# Patient Record
Sex: Female | Born: 1995
Health system: Southern US, Community
[De-identification: ages and names within clinical notes are randomized; demographics above are authoritative.]

## PROBLEM LIST (undated history)

## (undated) DIAGNOSIS — F419 Anxiety disorder, unspecified: Secondary | ICD-10-CM

---
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
## 2013-10-31 ENCOUNTER — Emergency Department: Payer: Self-pay | Admitting: Student

## 2013-11-02 ENCOUNTER — Emergency Department: Payer: Self-pay | Admitting: Emergency Medicine

## 2013-11-02 LAB — URINALYSIS, COMPLETE
BACTERIA: NONE SEEN
Bilirubin,UR: NEGATIVE
Blood: NEGATIVE
GLUCOSE, UR: NEGATIVE mg/dL (ref 0–75)
Nitrite: NEGATIVE
PH: 5 (ref 4.5–8.0)
Protein: 30
RBC,UR: 22 /HPF (ref 0–5)
Specific Gravity: 1.029 (ref 1.003–1.030)

## 2013-11-02 LAB — WET PREP, GENITAL

## 2013-11-02 LAB — BETA STREP CULTURE(ARMC)

## 2014-06-28 ENCOUNTER — Emergency Department
Admit: 2014-06-28 | Disposition: A | Discharge status: Home / Self Care | Payer: Self-pay | Admitting: Emergency Medicine

## 2014-07-01 LAB — BETA STREP CULTURE(ARMC)

## 2014-11-06 ENCOUNTER — Emergency Department
Admission: EM | Admit: 2014-11-06 | Discharge: 2014-11-06 | Disposition: A | Discharge status: Home / Self Care | Payer: Self-pay | Attending: Emergency Medicine | Admitting: Emergency Medicine

## 2014-11-06 ENCOUNTER — Encounter: Payer: Self-pay | Admitting: Emergency Medicine

## 2014-11-06 DIAGNOSIS — L0201 Cutaneous abscess of face: Secondary | ICD-10-CM | POA: Insufficient documentation

## 2014-11-06 DIAGNOSIS — Z720 Tobacco use: Secondary | ICD-10-CM | POA: Insufficient documentation

## 2014-11-06 MED ORDER — HYDROCODONE-ACETAMINOPHEN 5-325 MG PO TABS
1.0000 | ORAL_TABLET | ORAL | Status: DC | PRN
Start: 2014-11-06 — End: 2015-04-02

## 2014-11-06 MED ORDER — SULFAMETHOXAZOLE-TRIMETHOPRIM 800-160 MG PO TABS: 1.0000 | ORAL_TABLET | Freq: Two times a day (BID) | ORAL | Status: DC

## 2014-11-06 NOTE — ED Notes (Signed)
Developed swelling to left side of face 2 days ago.Marland Kitchen

## 2014-11-06 NOTE — Discharge Instructions (Signed)
Abscess An abscess (boil or furuncle) is an infected area on or under the skin. This area is filled with yellowish-white fluid (pus) and other material (debris). HOME CARE   Only take medicines as told by your doctor.  If you were given antibiotic medicine, take it as directed. Finish the medicine even if you start to feel better.  If gauze is used, follow your doctor's directions for changing the gauze.  To avoid spreading the infection:  Keep your abscess covered with a bandage.  Wash your hands well.  Do not share personal care items, towels, or whirlpools with others.  Avoid skin contact with others.  Keep your skin and clothes clean around the abscess.  Keep all doctor visits as told. GET HELP RIGHT AWAY IF:   You have more pain, puffiness (swelling), or redness in the wound site.  You have more fluid or blood coming from the wound site.  You have muscle aches, chills, or you feel sick.  You have a fever. MAKE SURE YOU:   Understand these instructions.  Will watch your condition.  Will get help right away if you are not doing well or get worse. Document Released: 08/14/2007 Document Revised: 08/27/2011 Document Reviewed: 05/10/2011 [facility] Patient Information 2015 [place], [place]. This information is not intended to replace advice given to you by your health care provider. Make sure you discuss any questions you have with your health care provider.    MOIST WARM COMPRESSES TO FACE FREQUENTLY SEPTRA DS FOR INFECTION, NORCO FOR PAIN  RETURN TO ER IF ANY SEVERE WORSENING OF YOUR ABSCESS

## 2014-11-06 NOTE — ED Notes (Addendum)
Left sided facial swelling, no dental of note.

## 2014-11-06 NOTE — ED Provider Notes (Signed)
[facility] Emergency Department Provider Note  ____________________________________________  Time seen: Approximately 3:03 PM  I have reviewed the triage vital signs and the nursing notes.   HISTORY  Chief Complaint Facial Swelling   HPI Brittany Braun is a 19 y.o. female is here with complaint of left-sided facial swelling for 2 days. Patient states that she does not have any dental problems that she is aware of. She has not had any fever or chills. No nausea or vomiting. She woke this morning with her face being a little bit more swollen than yesterday. She has not taken any medication thus far for this. She rates her pain a 7 out of 10 at this time.   History reviewed. No pertinent past medical history.  There are no active problems to display for this patient.   History reviewed. No pertinent past surgical history.  Current Outpatient Rx  Name  Route  Sig  Dispense  Refill  . HYDROcodone-acetaminophen (NORCO/VICODIN) 5-325 MG per tablet   Oral   Take 1 tablet by mouth every 4 (four) hours as needed for moderate pain.   20 tablet   0   . sulfamethoxazole-trimethoprim (BACTRIM DS,SEPTRA DS) 800-160 MG per tablet   Oral   Take 1 tablet by mouth 2 (two) times daily.   20 tablet   0     Allergies Review of patient's allergies indicates no known allergies.  No family history on file.  Social History Social History  Substance Use Topics  . Smoking status: Current Every Day Smoker  . Smokeless tobacco: None  . Alcohol Use: No    Review of Systems Constitutional: No fever/chills Eyes: No visual changes. ENT: No sore throat. Cardiovascular: Denies chest pain. Respiratory: Denies shortness of breath. Gastrointestinal:.  No nausea, no vomiting.  Genitourinary: Negative for dysuria. Musculoskeletal: Negative for back pain. Skin: Questionable abscess. Neurological: Negative for headaches, focal weakness or numbness.  10-point ROS  otherwise negative.  ____________________________________________   PHYSICAL EXAM:  VITAL SIGNS: ED Triage Vitals  Enc Vitals Group     BP 11/06/14 1354 113/67 mmHg     Pulse Rate 11/06/14 1354 74     Resp 11/06/14 1354 20     Temp 11/06/14 1354 99.2 F (37.3 C)     Temp Source 11/06/14 1354 Oral     SpO2 11/06/14 1354 99 %     Weight 11/06/14 1354 200 lb (90.719 kg)     Height 11/06/14 1354 5\' 6"  (1.676 m)     Head Cir --      Peak Flow --      Pain Score 11/06/14 1354 7     Pain Loc --      Pain Edu? --      Excl. in GC? --     Constitutional: Alert and oriented. Well appearing and in no acute distress. Eyes: Conjunctivae are normal. PERRL. EOMI. Head: Atraumatic. Nose: No congestion/rhinnorhea. Mouth/Throat: Mucous membranes are moist.  Oropharynx non-erythematous. No dental abscess is seen. Gums is not swollen or tender. Neck: No stridor.   Hematological/Lymphatic/Immunilogical: No cervical adenopathy bilaterally Cardiovascular: Normal rate, regular rhythm. Grossly normal heart sounds.  Good peripheral circulation. Respiratory: Normal respiratory effort.  No retractions. Lungs CTAB. Gastrointestinal: Soft and nontender. No distention. No abdominal bruits. No CVA tenderness. Musculoskeletal: No lower extremity tenderness nor edema.  No joint effusions. Neurologic:  Normal speech and language. No gross focal neurologic deficits are appreciated. No gait instability. Skin:  Skin is warm, dry  and intact. 2 left of her mouth there is a very hard, tender nonfluctuant nodule approximately 2 cm in diameter with mild erythema. Psychiatric: Mood and affect are normal. Speech and behavior are normal.  ____________________________________________   LABS (all labs ordered are listed, but only abnormal results are displayed)  Labs Reviewed - No data to display  PROCEDURES  Procedure(s) performed: None  Critical Care performed:  No  ____________________________________________   INITIAL IMPRESSION / ASSESSMENT AND PLAN / ED COURSE  Pertinent labs & imaging results that were available during my care of the patient were reviewed by me and considered in my medical decision making (see chart for details).  Patient was given a prescription for Norco as needed for pain and Septra DS for infection. She is encouraged to use warm compresses frequently to the area and return to the emergency room in 2 days if not improving, fever, or fluctuant area for [place] to I&D. ____________________________________________   FINAL CLINICAL IMPRESSION(S) / ED DIAGNOSES  Final diagnoses:  Facial abscess      Tommi Rumps, PA-C 11/06/14 1607  Emily Filbert, MD 11/06/14 [phone]

## 2015-04-02 ENCOUNTER — Encounter: Payer: Self-pay | Admitting: Emergency Medicine

## 2015-04-02 ENCOUNTER — Emergency Department
Admission: EM | Admit: 2015-04-02 | Discharge: 2015-04-03 | Disposition: A | Discharge status: Home / Self Care | Payer: Self-pay | Attending: Emergency Medicine | Admitting: Emergency Medicine

## 2015-04-02 DIAGNOSIS — F17210 Nicotine dependence, cigarettes, uncomplicated: Secondary | ICD-10-CM | POA: Insufficient documentation

## 2015-04-02 DIAGNOSIS — Y998 Other external cause status: Secondary | ICD-10-CM | POA: Insufficient documentation

## 2015-04-02 DIAGNOSIS — Z23 Encounter for immunization: Secondary | ICD-10-CM | POA: Insufficient documentation

## 2015-04-02 DIAGNOSIS — Y9289 Other specified places as the place of occurrence of the external cause: Secondary | ICD-10-CM | POA: Insufficient documentation

## 2015-04-02 DIAGNOSIS — S81811A Laceration without foreign body, right lower leg, initial encounter: Secondary | ICD-10-CM

## 2015-04-02 DIAGNOSIS — S71111A Laceration without foreign body, right thigh, initial encounter: Secondary | ICD-10-CM | POA: Insufficient documentation

## 2015-04-02 DIAGNOSIS — Y288XXA Contact with other sharp object, undetermined intent, initial encounter: Secondary | ICD-10-CM | POA: Insufficient documentation

## 2015-04-02 DIAGNOSIS — Y9389 Activity, other specified: Secondary | ICD-10-CM | POA: Insufficient documentation

## 2015-04-02 MED ORDER — NAPROXEN 500 MG PO TABS: 500.0000 mg | ORAL_TABLET | Freq: Two times a day (BID) | ORAL | Status: DC

## 2015-04-02 MED ORDER — HYDROCODONE-ACETAMINOPHEN 5-325 MG PO TABS
2.0000 | ORAL_TABLET | Freq: Once | ORAL | Status: AC
  Administered 2015-04-03: 2 via ORAL

## 2015-04-02 MED ORDER — LIDOCAINE HCL (PF) 1 % IJ SOLN
INTRAMUSCULAR | Status: AC
  Filled 2015-04-02: qty 5

## 2015-04-02 MED ORDER — LIDOCAINE HCL (PF) 1 % IJ SOLN
INTRAMUSCULAR | Status: AC
  Administered 2015-04-02: 5 mL via INTRADERMAL
  Filled 2015-04-02: qty 5

## 2015-04-02 MED ORDER — TETANUS-DIPHTH-ACELL PERTUSSIS 5-2.5-18.5 LF-MCG/0.5 IM SUSP
INTRAMUSCULAR | Status: AC
  Administered 2015-04-02: 0.5 mL via INTRAMUSCULAR
  Filled 2015-04-02: qty 0.5

## 2015-04-02 MED ORDER — TETANUS-DIPHTH-ACELL PERTUSSIS 5-2.5-18.5 LF-MCG/0.5 IM SUSP
0.5000 mL | Freq: Once | INTRAMUSCULAR | Status: AC
  Administered 2015-04-02: 0.5 mL via INTRAMUSCULAR

## 2015-04-02 MED ORDER — LIDOCAINE HCL (PF) 1 % IJ SOLN
5.0000 mL | Freq: Once | INTRAMUSCULAR | Status: AC
  Administered 2015-04-02: 5 mL via INTRADERMAL

## 2015-04-02 MED ORDER — HYDROCODONE-ACETAMINOPHEN 5-325 MG PO TABS: 1.0000 | ORAL_TABLET | ORAL | Status: DC | PRN

## 2015-04-02 NOTE — ED Provider Notes (Signed)
[facility] Emergency Department Provider Note  ____________________________________________  Time seen: Approximately 11:30 PM  I have reviewed the triage vital signs and the nursing notes.   HISTORY  Chief Complaint Extremity Laceration    HPI Brittany Braun is a 20 y.o. female presents for evaluation of laceration to her right upper thigh. Patient states that she was cutting open a box with a box cutter when she sliced her leg. States that this happened approximately 11 hours ago almost 12 hours ago.   History reviewed. No pertinent past medical history.  There are no active problems to display for this patient.   History reviewed. No pertinent past surgical history.  Current Outpatient Rx  Name  Route  Sig  Dispense  Refill  . HYDROcodone-acetaminophen (NORCO) 5-325 MG tablet   Oral   Take 1-2 tablets by mouth every 4 (four) hours as needed for moderate pain.   8 tablet   0   . naproxen (NAPROSYN) 500 MG tablet   Oral   Take 1 tablet (500 mg total) by mouth 2 (two) times daily with a meal.   60 tablet   0     Allergies Review of patient's allergies indicates no known allergies.  History reviewed. No pertinent family history.  Social History Social History  Substance Use Topics  . Smoking status: Current Every Day Smoker -- 3.00 packs/day    Types: Cigarettes  . Smokeless tobacco: None  . Alcohol Use: No    Review of Systems Constitutional: No fever/chills Eyes: No visual changes. ENT: No sore throat. Cardiovascular: Denies chest pain. Respiratory: Denies shortness of breath. Gastrointestinal: No abdominal pain.  No nausea, no vomiting.  No diarrhea.  No constipation. Genitourinary: Negative for dysuria. Musculoskeletal: Negative for back pain. Skin: Laceration right upper leg. Neurological: Negative for headaches, focal weakness or numbness.  10-point ROS otherwise  negative.  ____________________________________________   PHYSICAL EXAM:  VITAL SIGNS: ED Triage Vitals  Enc Vitals Group     BP 04/02/15 2322 122/65 mmHg     Pulse Rate 04/02/15 2322 86     Resp 04/02/15 2322 18     Temp 04/02/15 2322 97.7 F (36.5 C)     Temp Source 04/02/15 2322 Oral     SpO2 04/02/15 2322 100 %     Weight 04/02/15 2322 210 lb (95.255 kg)     Height 04/02/15 2322  (1.676 m)     Head Cir --      Peak Flow --      Pain Score 04/02/15 2323 9     Pain Loc --      Pain Edu? --      Excl. in GC? --     Constitutional: Alert and oriented. Well appearing and in no acute distress. Cardiovascular: Normal rate, regular rhythm. Grossly normal heart sounds.  Good peripheral circulation. Respiratory: Normal respiratory effort.  No retractions. Lungs CTAB. Gastrointestinal: Soft and nontender. No distention. No abdominal bruits. No CVA tenderness. Musculoskeletal: No lower extremity tenderness nor edema.  No joint effusions. Neurologic:  Normal speech and language. No gross focal neurologic deficits are appreciated. No gait instability. Skin:  Skin is warm, dry and an as laceration noted to the right upper thigh. Wound is clean. Psychiatric: Mood and affect are normal. Speech and behavior are normal.  ____________________________________________   LABS (all labs ordered are listed, but only abnormal results are displayed)  Labs Reviewed - No data to display    PROCEDURES  Procedure(s) performed: Yes  LACERATION REPAIR Performed by: Evangeline Dakin Authorized by: Evangeline Dakin Consent: Verbal consent obtained. Risks and benefits: risks, benefits and alternatives were discussed Consent given by: patient Patient identity confirmed: provided demographic data Prepped and Draped in normal sterile fashion Wound explored  Laceration Location: Right upper thigh  Laceration Length: 9 cm  No Foreign Bodies seen or palpated  Anesthesia: local  infiltration  Local anesthetic: lidocaine 1% with out epinephrine  Anesthetic total: 10 ml  Irrigation method: syringe Amount of cleaning: standard  Skin closure: Staples   Number of staples: 8   Technique: Simple interrupted   Patient tolerance: Patient tolerated the procedure well with no immediate complications. Critical Care performed: No  ____________________________________________   INITIAL IMPRESSION / ASSESSMENT AND PLAN / ED COURSE  Pertinent labs & imaging results that were available during my care of the patient were reviewed by me and considered in my medical decision making (see chart for details).  Laceration right upper thigh with box cutter staples placed as noted in the procedure above. Patient return in 1 week for staple removal. Rx given for hydrocodone and Naprosyn 500 mg twice a day. Patient follow-up with PCP as needed. Tetanus given while in the ED. Patient discharged home and voices no other complaints at this time. ____________________________________________   FINAL CLINICAL IMPRESSION(S) / ED DIAGNOSES  Final diagnoses:  Laceration of leg, right, initial encounter     Evangeline Dakin, PA-C 04/03/15 0002  Arnaldo Natal, MD 04/14/15 [phone]

## 2015-04-02 NOTE — ED Notes (Signed)
Pt says she was opening a package and cut her right thigh; this occurred around noon today; clean laceration, bleeding controlled;

## 2015-04-03 MED ORDER — HYDROCODONE-ACETAMINOPHEN 5-325 MG PO TABS
ORAL_TABLET | ORAL | Status: AC
Start: 2015-04-03 — End: 2015-04-03
  Administered 2015-04-03: 2 via ORAL
  Filled 2015-04-03: qty 2

## 2015-04-03 NOTE — Discharge Instructions (Signed)
Laceration Care, Adult °A laceration is a cut that goes through all layers of the skin. The cut also goes into the tissue that is right under the skin. Some cuts heal on their own. Others need to be closed with stitches (sutures), staples, skin adhesive strips, or wound glue. Taking care of your cut lowers your risk of infection and helps your cut to heal better. °HOW TO TAKE CARE OF YOUR CUT °For stitches or staples: °· Keep the wound clean and dry. °· If you were given a bandage (dressing), you should change it at least one time per day or as told by your doctor. You should also change it if it gets wet or dirty. °· Keep the wound completely dry for the first 24 hours or as told by your doctor. After that time, you may take a shower or a bath. However, make sure that the wound is not soaked in water until after the stitches or staples have been removed. °· Clean the wound one time each day or as told by your doctor: °· Wash the wound with soap and water. °· Rinse the wound with water until all of the soap comes off. °· Pat the wound dry with a clean towel. Do not rub the wound. °· After you clean the wound, put a thin layer of antibiotic ointment on it as told by your doctor. This ointment: °· Helps to prevent infection. °· Keeps the bandage from sticking to the wound. °· Have your stitches or staples removed as told by your doctor. °If your doctor used skin adhesive strips:  °· Keep the wound clean and dry. °· If you were given a bandage, you should change it at least one time per day or as told by your doctor. You should also change it if it gets dirty or wet. °· Do not get the skin adhesive strips wet. You can take a shower or a bath, but be careful to keep the wound dry. °· If the wound gets wet, pat it dry with a clean towel. Do not rub the wound. °· Skin adhesive strips fall off on their own. You can trim the strips as the wound heals. Do not remove any strips that are still stuck to the wound. They will  fall off after a while. °If your doctor used wound glue: °· Try to keep your wound dry, but you may briefly wet it in the shower or bath. Do not soak the wound in water, such as by swimming. °· After you take a shower or a bath, gently pat the wound dry with a clean towel. Do not rub the wound. °· Do not do any activities that will make you really sweaty until the skin glue has fallen off on its own. °· Do not apply liquid, cream, or ointment medicine to your wound while the skin glue is still on. °· If you were given a bandage, you should change it at least one time per day or as told by your doctor. You should also change it if it gets dirty or wet. °· If a bandage is placed over the wound, do not let the tape for the bandage touch the skin glue. °· Do not pick at the glue. The skin glue usually stays on for 5-10 days. Then, it falls off of the skin. °General Instructions  °· To help prevent scarring, make sure to cover your wound with sunscreen whenever you are outside after stitches are removed, after adhesive strips are removed,   or when wound glue stays in place and the wound is healed. Make sure to wear a sunscreen of at least 30 SPF. °· Take over-the-counter and prescription medicines only as told by your doctor. °· If you were given antibiotic medicine or ointment, take or apply it as told by your doctor. Do not stop using the antibiotic even if your wound is getting better. °· Do not scratch or pick at the wound. °· Keep all follow-up visits as told by your doctor. This is important. °· Check your wound every day for signs of infection. Watch for: °· Redness, swelling, or pain. °· Fluid, blood, or pus. °· Raise (elevate) the injured area above the level of your heart while you are sitting or lying down, if possible. °GET HELP IF: °· You got a tetanus shot and you have any of these problems at the injection site: °¨ Swelling. °¨ Very bad pain. °¨ Redness. °¨ Bleeding. °· You have a fever. °· A wound that was  closed breaks open. °· You notice a bad smell coming from your wound or your bandage. °· You notice something coming out of the wound, such as wood or glass. °· Medicine does not help your pain. °· You have more redness, swelling, or pain at the site of your wound. °· You have fluid, blood, or pus coming from your wound. °· You notice a change in the color of your skin near your wound. °· You need to change the bandage often because fluid, blood, or pus is coming from the wound. °· You start to have a new rash. °· You start to have numbness around the wound. °GET HELP RIGHT AWAY IF: °· You have very bad swelling around the wound. °· Your pain suddenly gets worse and is very bad. °· You notice painful lumps near the wound or on skin that is anywhere on your body. °· You have a red streak going away from your wound. °· The wound is on your hand or foot and you cannot move a finger or toe like you usually can. °· The wound is on your hand or foot and you notice that your fingers or toes look pale or bluish. °  °This information is not intended to replace advice given to you by your health care provider. Make sure you discuss any questions you have with your health care provider. °  °Document Released: 08/14/2007 Document Revised: 07/12/2014 Document Reviewed: 02/21/2014 °Elsevier Interactive Patient Education ©2016 [employer] ° °Stitches, Staples, or Adhesive Wound Closure °Health care providers use stitches (sutures), staples, and certain glue (skin adhesives) to hold skin together while it heals (wound closure). You may need this treatment after you have surgery or if you cut your skin accidentally. These methods help your skin to heal more quickly and make it less likely that you will have a scar. A wound may take several months to heal completely. °The type of wound you have determines when your wound gets closed. In most cases, the wound is closed as soon as possible (primary skin closure). Sometimes, closure  is delayed so the wound can be cleaned and allowed to heal naturally. This reduces the chance of infection. Delayed closure may be needed if your wound: °· Is caused by a bite. °· Happened more than 6 hours ago. °· Involves loss of skin or the tissues under the skin. °· Has dirt or debris in it that cannot be removed. °· Is infected. °WHAT ARE THE DIFFERENT KINDS OF WOUND   CLOSURES? °There are many options for wound closure. The one that your health care provider uses depends on how deep and how large your wound is. °Adhesive Glue °To use this type of glue to close a wound, your health care provider holds the edges of the wound together and paints the glue on the surface of your skin. You may need more than one layer of glue. Then the wound may be covered with a light bandage (dressing). °This type of skin closure may be used for small wounds that are not deep (superficial). Using glue for wound closure is less painful than other methods. It does not require a medicine that numbs the area (local anesthetic). This method also leaves nothing to be removed. Adhesive glue is often used for children and on facial wounds. °Adhesive glue cannot be used for wounds that are deep, uneven, or bleeding. It is not used inside of a wound.  °Adhesive Strips °These strips are made of sticky (adhesive), porous paper. They are applied across your skin edges like a regular adhesive bandage. You leave them on until they fall off. °Adhesive strips may be used to close very superficial wounds. They may also be used along with sutures to improve the closure of your skin edges.  °Sutures °Sutures are the oldest method of wound closure. Sutures can be made from natural substances, such as silk, or from synthetic materials, such as nylon and steel. They can be made from a material that your body can break down as your wound heals (absorbable), or they can be made from a material that needs to be removed from your skin (nonabsorbable). They  come in many different strengths and sizes. °Your health care provider attaches the sutures to a steel needle on one end. Sutures can be passed through your skin, or through the tissues beneath your skin. Then they are tied and cut. Your skin edges may be closed in one continuous stitch or in separate stitches. °Sutures are strong and can be used for all kinds of wounds. Absorbable sutures may be used to close tissues under the skin. The disadvantage of sutures is that they may cause skin reactions that lead to infection. Nonabsorbable sutures need to be removed. °Staples °When surgical staples are used to close a wound, the edges of your skin on both sides of the wound are brought close together. A staple is placed across the wound, and an instrument secures the edges together. Staples are often used to close surgical cuts (incisions). °Staples are faster to use than sutures, and they cause less skin reaction. Staples need to be removed using a tool that bends the staples away from your skin. °HOW DO I CARE FOR MY WOUND CLOSURE? °· Take medicines only as directed by your health care provider. °· If you were prescribed an antibiotic medicine for your wound, finish it all even if you start to feel better. °· Use ointments or creams only as directed by your health care provider. °· Wash your hands with soap and water before and after touching your wound. °· Do not soak your wound in water. Do not take baths, swim, or use a hot tub until your health care provider approves. °· Ask your health care provider when you can start showering. Cover your wound if directed by your health care provider. °· Do not take out your own sutures or staples. °· Do not pick at your wound. Picking can cause an infection. °· Keep all follow-up visits as directed by   your health care provider. This is important. °HOW LONG WILL I HAVE MY WOUND CLOSURE? °· Leave adhesive glue on your skin until the glue peels away. °· Leave adhesive strips on  your skin until the strips fall off. °· Absorbable sutures will dissolve within several days. °· Nonabsorbable sutures and staples must be removed. The location of the wound will determine how long they stay in. This can range from several days to a couple of weeks. °WHEN SHOULD I SEEK HELP FOR MY WOUND CLOSURE? °Contact your health care provider if: °· You have a fever. °· You have chills. °· You have drainage, redness, swelling, or pain at your wound. °· There is a bad smell coming from your wound. °· The skin edges of your wound start to separate after your sutures have been removed. °· Your wound becomes thick, raised, and darker in color after your sutures come out (scarring). °  °This information is not intended to replace advice given to you by your health care provider. Make sure you discuss any questions you have with your health care provider. °  °Document Released: 11/20/2000 Document Revised: 03/18/2014 Document Reviewed: 08/04/2013 °Elsevier Interactive Patient Education ©2016 [employer] ° °

## 2015-04-10 ENCOUNTER — Emergency Department
Admission: EM | Admit: 2015-04-10 | Discharge: 2015-04-10 | Disposition: A | Discharge status: Home / Self Care | Payer: Self-pay | Attending: Emergency Medicine | Admitting: Emergency Medicine

## 2015-04-10 ENCOUNTER — Encounter: Payer: Self-pay | Admitting: Urgent Care

## 2015-04-10 DIAGNOSIS — Z4802 Encounter for removal of sutures: Secondary | ICD-10-CM | POA: Insufficient documentation

## 2015-04-10 NOTE — ED Provider Notes (Signed)
[facility] Emergency Department Provider Note ____________________________________________  Time seen: 2344  I have reviewed the triage vital signs and the nursing notes.  HISTORY  Chief Complaint  Suture / Staple Removal  HPI  Brittany Braun is a 20 y.o. female presents for staple removal. The wound is well healed without signs of infection. There have been no interim complaints. Staples placed here a week ago after laceration with a box cutter.   Review of Systems  Constitutional: Negative for fever. HEENT:  Normocephalic/atraumatic. Negative for visual/hearing changes, sore throat, or nasal congestion. Cardiovascular: Negative for chest pain. Respiratory: Negative for shortness of breath. Musculoskeletal: Negative for back pain. Skin: right thigh s/p lac repair Neurological: Negative for headaches, focal weakness or numbness. Hematological/Lymphatic:Negative for enlarged lymph nodes  PHYSICAL EXAM:  VITAL SIGNS: ED Triage Vitals  Enc Vitals Group     BP 04/10/15 2258 132/78 mmHg     Pulse Rate 04/10/15 2258 84     Resp 04/10/15 2258 18     Temp 04/10/15 2258 98.4 F (36.9 C)     Temp Source 04/10/15 2258 Oral     SpO2 04/10/15 2258 100 %     Weight --      Height --      Head Cir --      Peak Flow --      Pain Score 04/10/15 2259 0     Pain Loc --      Pain Edu? --      Excl. in GC? --    Constitutional: Alert and oriented. Well appearing and in no distress. Eyes: Conjunctivae are normal. PERRL. Normal extraocular movements. Head: Normocephalic and atraumatic. Neck: Supple. No thyromegaly. Hematological/Lymphatic/Immunological: No cervical lymphadenopathy. Cardiovascular: Normal rate, regular rhythm.  Respiratory: Normal respiratory effort.  Musculoskeletal: Nontender with normal range of motion in all extremities.  Neurologic:  Normal gait without ataxia. Normal speech and language. No gross focal neurologic deficits are  appreciated. Skin:  Skin is warm, dry and intact. No rash noted. Well-healed linear laceration to the upper right thigh with staples in place. No signs of infection.  Psychiatric: Mood and affect are normal. Patient exhibits appropriate insight and judgment. ____________________________________________   SUTURE REMOVAL Performed by: Lissa Hoard  Consent: Verbal consent obtained. Patient identity confirmed: provided demographic data  Location details: right thigh  Wound Appearance: clean  Sutures/Staples Removed: 9 staples  Facility: sutures placed in this facility   Patient tolerance: Patient tolerated the procedure well with no immediate complications.  INITIAL IMPRESSION / ASSESSMENT AND PLAN / ED COURSE  The staples are removed. Wound care and activity instructions given. Return prn.  FINAL CLINICAL IMPRESSION(S) / ED DIAGNOSES  Final diagnoses:  Encounter for staple removal    Lissa Hoard, PA-C 04/11/15 0034  Sharyn Creamer, MD 04/11/15 2047

## 2015-04-10 NOTE — Discharge Instructions (Signed)
Suture Removal, Care After Refer to this sheet in the next few weeks. These instructions provide you with information on caring for yourself after your procedure. Your health care provider may also give you more specific instructions. Your treatment has been planned according to current medical practices, but problems sometimes occur. Call your health care provider if you have any problems or questions after your procedure. WHAT TO EXPECT AFTER THE PROCEDURE After your stitches (sutures) are removed, it is typical to have the following:  Some discomfort and swelling in the wound area.  Slight redness in the area. HOME CARE INSTRUCTIONS   If you have skin adhesive strips over the wound area, do not take the strips off. They will fall off on their own in a few days. If the strips remain in place after 14 days, you may remove them.  Change any bandages (dressings) at least once a day or as directed by your health care provider. If the bandage sticks, soak it off with warm, soapy water.  Apply cream or ointment only as directed by your health care provider. If using cream or ointment, wash the area with soap and water 2 times a day to remove all the cream or ointment. Rinse off the soap and pat the area dry with a clean towel.  Keep the wound area dry and clean. If the bandage becomes wet or dirty, or if it develops a bad smell, change it as soon as possible.  Continue to protect the wound from injury.  Use sunscreen when out in the sun. New scars become sunburned easily. SEEK MEDICAL CARE IF:  You have increasing redness, swelling, or pain in the wound.  You see pus coming from the wound.  You have a fever.  You notice a bad smell coming from the wound or dressing.  Your wound breaks open (edges not staying together).   This information is not intended to replace advice given to you by your health care provider. Make sure you discuss any questions you have with your health care  provider.   Document Released: 11/20/2000 Document Revised: 12/16/2012 Document Reviewed: 10/07/2012 Elsevier Interactive Patient Education [employer]  Keep the wound clean, dry, and covered.

## 2015-04-10 NOTE — ED Notes (Signed)
Patient presents for staple removal from RIGHT leg; has 9 in place per her report. Denies s/s of infection; no fever.

## 2015-04-10 NOTE — ED Notes (Signed)

## 2015-07-07 ENCOUNTER — Emergency Department
Admission: EM | Admit: 2015-07-07 | Discharge: 2015-07-07 | Disposition: A | Discharge status: Home / Self Care | Payer: Self-pay | Attending: Emergency Medicine | Admitting: Emergency Medicine

## 2015-07-07 DIAGNOSIS — R197 Diarrhea, unspecified: Secondary | ICD-10-CM | POA: Insufficient documentation

## 2015-07-07 DIAGNOSIS — F17210 Nicotine dependence, cigarettes, uncomplicated: Secondary | ICD-10-CM | POA: Insufficient documentation

## 2015-07-07 DIAGNOSIS — Z5321 Procedure and treatment not carried out due to patient leaving prior to being seen by health care provider: Secondary | ICD-10-CM | POA: Insufficient documentation

## 2015-07-07 LAB — URINALYSIS COMPLETE WITH MICROSCOPIC (ARMC ONLY)
BILIRUBIN URINE: NEGATIVE
Bacteria, UA: NONE SEEN
GLUCOSE, UA: NEGATIVE mg/dL
Leukocytes, UA: NEGATIVE
Nitrite: NEGATIVE
Protein, ur: NEGATIVE mg/dL
SPECIFIC GRAVITY, URINE: 1.024 (ref 1.005–1.030)
pH: 6 (ref 5.0–8.0)

## 2015-07-07 LAB — COMPREHENSIVE METABOLIC PANEL
ALBUMIN: 4.6 g/dL (ref 3.5–5.0)
ALT: 10 U/L — AB (ref 14–54)
AST: 16 U/L (ref 15–41)
Alkaline Phosphatase: 58 U/L (ref 38–126)
Anion gap: 8 (ref 5–15)
BUN: 13 mg/dL (ref 6–20)
CHLORIDE: 107 mmol/L (ref 101–111)
CO2: 24 mmol/L (ref 22–32)
CREATININE: 0.75 mg/dL (ref 0.44–1.00)
Calcium: 9.2 mg/dL (ref 8.9–10.3)
GFR calc Af Amer: >60 mL/min (ref >60)
GFR calc non Af Amer: >60 mL/min (ref >60)
Glucose, Bld: 89 mg/dL (ref 65–99)
POTASSIUM: 3.7 mmol/L (ref 3.5–5.1)
SODIUM: 139 mmol/L (ref 135–145)
Total Bilirubin: 0.3 mg/dL (ref 0.3–1.2)
Total Protein: 8 g/dL (ref 6.5–8.1)

## 2015-07-07 LAB — CBC
HEMATOCRIT: 35.1 % (ref 35.0–47.0)
Hemoglobin: 11.7 g/dL — ABNORMAL LOW (ref 12.0–16.0)
MCH: 27.8 pg (ref 26.0–34.0)
MCHC: 33.2 g/dL (ref 32.0–36.0)
MCV: 83.6 fL (ref 80.0–100.0)
PLATELETS: 236 10*3/uL (ref 150–440)
RBC: 4.2 MIL/uL (ref 3.80–5.20)
RDW: 15.4 % — AB (ref 11.5–14.5)
WBC: 6.4 10*3/uL (ref 3.6–11.0)

## 2015-07-07 LAB — LIPASE, BLOOD: LIPASE: 12 U/L (ref 11–51)

## 2015-07-07 MED ORDER — ONDANSETRON 4 MG PO TBDP
4.0000 mg | ORAL_TABLET | Freq: Once | ORAL | Status: AC | PRN
  Administered 2015-07-07: 4 mg via ORAL

## 2015-07-07 MED ORDER — ONDANSETRON 4 MG PO TBDP
ORAL_TABLET | ORAL | Status: AC
  Filled 2015-07-07: qty 1

## 2015-07-07 NOTE — ED Notes (Signed)
No answer when called for treatment room.  ?

## 2015-07-07 NOTE — ED Notes (Signed)
States she developed some n/v this am  Last time vomited was this am  Also having some abd cramping

## 2015-07-07 NOTE — ED Notes (Signed)
PT arrives to ER via POV from home, ambulatory to triage. Pt c/o nausea, and diarrhea all day. Pt vomit X 2 this AM but none sense. Denies abdominal pain. Pt alert and oriented X4, active, cooperative, pt in NAD. RR even and unlabored, color WNL.

## 2015-10-01 ENCOUNTER — Encounter: Payer: Self-pay | Admitting: Emergency Medicine

## 2015-10-01 ENCOUNTER — Emergency Department
Admission: EM | Admit: 2015-10-01 | Discharge: 2015-10-01 | Disposition: A | Discharge status: Home / Self Care | Payer: Self-pay | Attending: Emergency Medicine | Admitting: Emergency Medicine

## 2015-10-01 DIAGNOSIS — J029 Acute pharyngitis, unspecified: Secondary | ICD-10-CM | POA: Insufficient documentation

## 2015-10-01 DIAGNOSIS — F17210 Nicotine dependence, cigarettes, uncomplicated: Secondary | ICD-10-CM | POA: Insufficient documentation

## 2015-10-01 LAB — POCT RAPID STREP A: Streptococcus, Group A Screen (Direct): NEGATIVE

## 2015-10-01 MED ORDER — IBUPROFEN 800 MG PO TABS: 800.0000 mg | ORAL_TABLET | Freq: Three times a day (TID) | ORAL | 0 refills | Status: DC | PRN

## 2015-10-01 NOTE — ED Triage Notes (Signed)
Pt reports sore throat and right ear pain that started 3 days ago. Pain with swallowing fever 100.1 triage

## 2015-10-01 NOTE — ED Provider Notes (Signed)
[facility] Emergency Department Provider Note    ____________________________________________   I have reviewed the triage vital signs and the nursing notes.   HISTORY  Chief Complaint Sore Throat   History limited by: Not Limited   HPI Brittany Braun is a 20 y.o. female who presents to the emergency department today because of concerns for sore throat. Has been going on for the past 2-3 days. The pain is worse in the morning. It makes it difficult for her to swallow. The patient states that she also feels like the discomfort is going into her ears. This has happened twice in the past. She does not have a primary care doctor.    History reviewed. No pertinent past medical history.  There are no active problems to display for this patient.   History reviewed. No pertinent surgical history.  Current Outpatient Rx  . Order #: [id number] Class: Print  . Order #: [id number] Class: Print    Allergies Review of patient's allergies indicates no known allergies.  No family history on file.  Social History Social History  Substance Use Topics  . Smoking status: Current Every Day Smoker    Packs/day: 3.00    Types: Cigarettes  . Smokeless tobacco: Never Used  . Alcohol use Yes    Review of Systems  Constitutional: Positive for low grade fever. Cardiovascular: Negative for chest pain. Respiratory: Negative for shortness of breath. Gastrointestinal: Negative for abdominal pain, vomiting and diarrhea. Neurological: Negative for headaches, focal weakness or numbness.  10-point ROS otherwise negative.  ____________________________________________   PHYSICAL EXAM:  VITAL SIGNS: ED Triage Vitals  Enc Vitals Group     BP 10/01/15 1807 122/79     Pulse Rate 10/01/15 1807 (!) 117     Resp 10/01/15 1807 20     Temp 10/01/15 1807 100.1 F (37.8 C)     Temp Source 10/01/15 1807 Oral     SpO2 10/01/15 1807 98 %     Weight 10/01/15 1807 200 lb  (90.7 kg)     Height 10/01/15 1807  (1.676 m)     Head Circumference --      Peak Flow --      Pain Score 10/01/15 1808 10   Constitutional: Alert and oriented. Well appearing and in no distress. Eyes: Conjunctivae are normal. PERRL. Normal extraocular movements. ENT   Head: Normocephalic and atraumatic.   Nose: No congestion/rhinnorhea.   Mouth/Throat: Mucous membranes are moist. Bilateral tonsil swelling, some exudates. No uvula deviation.   Neck: No stridor. Hematological/Lymphatic/Immunilogical: No cervical lymphadenopathy. Cardiovascular: Normal rate, regular rhythm.  No murmurs, rubs, or gallops. Respiratory: Normal respiratory effort without tachypnea nor retractions. Breath sounds are clear and equal bilaterally. No wheezes/rales/rhonchi. Gastrointestinal: Soft and nontender. No distention.  Genitourinary: Deferred Musculoskeletal: Normal range of motion in all extremities. No joint effusions.   Neurologic:  Normal speech and language. No gross focal neurologic deficits are appreciated.  Skin:  Skin is warm, dry and intact. No rash noted. Psychiatric: Mood and affect are normal. Speech and behavior are normal. Patient exhibits appropriate insight and judgment.  ____________________________________________    LABS (pertinent positives/negatives)  Labs Reviewed  POCT RAPID STREP A     ____________________________________________   EKG  None  ____________________________________________    RADIOLOGY  None  ____________________________________________   PROCEDURES  Procedures  ____________________________________________   INITIAL IMPRESSION / ASSESSMENT AND PLAN / ED COURSE  Pertinent labs & imaging results that were available during my care of the patient  were reviewed by me and considered in my medical decision making (see chart for details).  Patient presents with concern for sore throat. Will check rapid strep.   Clinical  Course   Rapid strep negative. Think likely viral pharyngitis. Will give NSAID prescription. ____________________________________________   FINAL CLINICAL IMPRESSION(S) / ED DIAGNOSES  Final diagnoses:  Pharyngitis     Note: This dictation was prepared with Dragon dictation. Any transcriptional errors that result from this process are unintentional    Phineas Semen, MD 10/01/15 2036

## 2015-10-01 NOTE — Discharge Instructions (Signed)
Please seek medical attention for any high fevers, chest pain, shortness of breath, change in behavior, persistent vomiting, bloody stool or any other new or concerning symptoms.  

## 2015-10-03 ENCOUNTER — Emergency Department
Admission: EM | Admit: 2015-10-03 | Discharge: 2015-10-03 | Disposition: A | Discharge status: Home / Self Care | Payer: Self-pay | Attending: Emergency Medicine | Admitting: Emergency Medicine

## 2015-10-03 DIAGNOSIS — J36 Peritonsillar abscess: Secondary | ICD-10-CM | POA: Insufficient documentation

## 2015-10-03 DIAGNOSIS — F17210 Nicotine dependence, cigarettes, uncomplicated: Secondary | ICD-10-CM | POA: Insufficient documentation

## 2015-10-03 LAB — COMPREHENSIVE METABOLIC PANEL
ALK PHOS: 64 U/L (ref 38–126)
ALT: 23 U/L (ref 14–54)
AST: 23 U/L (ref 15–41)
Albumin: 3.9 g/dL (ref 3.5–5.0)
Anion gap: 8 (ref 5–15)
BILIRUBIN TOTAL: 0.7 mg/dL (ref 0.3–1.2)
BUN: 7 mg/dL (ref 6–20)
CALCIUM: 8.8 mg/dL — AB (ref 8.9–10.3)
CHLORIDE: 104 mmol/L (ref 101–111)
CO2: 24 mmol/L (ref 22–32)
CREATININE: 0.67 mg/dL (ref 0.44–1.00)
Glucose, Bld: 81 mg/dL (ref 65–99)
Potassium: 3.7 mmol/L (ref 3.5–5.1)
Sodium: 136 mmol/L (ref 135–145)
Total Protein: 7.7 g/dL (ref 6.5–8.1)

## 2015-10-03 LAB — CBC WITH DIFFERENTIAL/PLATELET
BASOS ABS: 0.1 10*3/uL (ref 0–0.1)
Basophils Relative: 1 %
EOS PCT: 0 %
Eosinophils Absolute: 0 10*3/uL (ref 0–0.7)
HEMATOCRIT: 32.3 % — AB (ref 35.0–47.0)
HEMOGLOBIN: 11.1 g/dL — AB (ref 12.0–16.0)
LYMPHS ABS: 1.4 10*3/uL (ref 1.0–3.6)
LYMPHS PCT: 16 %
MCH: 28.5 pg (ref 26.0–34.0)
MCHC: 34.5 g/dL (ref 32.0–36.0)
MCV: 82.5 fL (ref 80.0–100.0)
Monocytes Absolute: 0.9 10*3/uL (ref 0.2–0.9)
Monocytes Relative: 10 %
NEUTROS ABS: 6.5 10*3/uL (ref 1.4–6.5)
Neutrophils Relative %: 73 %
PLATELETS: 240 10*3/uL (ref 150–440)
RBC: 3.91 MIL/uL (ref 3.80–5.20)
RDW: 14.3 % (ref 11.5–14.5)
WBC: 8.8 10*3/uL (ref 3.6–11.0)

## 2015-10-03 LAB — MONONUCLEOSIS SCREEN: MONO SCREEN: NEGATIVE

## 2015-10-03 LAB — POCT PREGNANCY, URINE: PREG TEST UR: NEGATIVE

## 2015-10-03 MED ORDER — AMOXICILLIN-POT CLAVULANATE 875-125 MG PO TABS: 1.0000 | ORAL_TABLET | Freq: Two times a day (BID) | ORAL | 0 refills | Status: AC

## 2015-10-03 MED ORDER — HYDROCODONE-ACETAMINOPHEN 5-325 MG PO TABS: 1.0000 | ORAL_TABLET | ORAL | 0 refills | Status: DC | PRN

## 2015-10-03 MED ORDER — SODIUM CHLORIDE 0.9 % IV SOLN
3.0000 g | Freq: Once | INTRAVENOUS | Status: AC
  Administered 2015-10-03: 3 g via INTRAVENOUS
  Filled 2015-10-03: qty 3

## 2015-10-03 MED ORDER — DEXAMETHASONE SODIUM PHOSPHATE 10 MG/ML IJ SOLN
10.0000 mg | Freq: Once | INTRAMUSCULAR | Status: AC
  Administered 2015-10-03: 10 mg via INTRAVENOUS
  Filled 2015-10-03: qty 1

## 2015-10-03 NOTE — ED Triage Notes (Signed)
Pt states she was seen here 2 days with sore throat and sinus pressure pain, states her sx are not getting any better and has been taking IBU without any relief.Marland Kitchen

## 2015-10-03 NOTE — Discharge Instructions (Signed)
Continue taking antibiotics until completely finished. Norco as needed for pain. Take Augmentin 875 twice a day for 10 days. Follow-up with Dr. Andee Poles if any continued problems. You will need to call and make an appointment in his office.

## 2015-10-03 NOTE — ED Provider Notes (Signed)
[facility] Emergency Department Provider Note  ____________________________________________  Time seen: Approximately 1:52 PM  I have reviewed the triage vital signs and the nursing notes.   HISTORY  Chief Complaint Sore Throat and Nasal Congestion    HPI Brittany Braun is a 20 y.o. female is here with complaint of sore throat. Patient was seen in the emergency room approximately 2 days ago at which time it was thought that her sore throat was file. Patient states that she is continued to have throat pain and a low-grade fever. Patient states that she took ibuprofen this morning and approximately 4 hours prior to arrival in the emergency room. Patient continues to eat and drink and denies any difficulty swallowing her saliva.Currently she rates her pain as a 10 over 10. She denies any nausea, vomiting. She states nothing is helping with her throat pain and that eating sometimes makes her pain worse.   History reviewed. No pertinent past medical history.  There are no active problems to display for this patient.   History reviewed. No pertinent surgical history.  Current Outpatient Rx  . Order #: [id number] Class: Print  . Order #: [id number] Class: Print    Allergies Review of patient's allergies indicates no known allergies.  No family history on file.  Social History Social History  Substance Use Topics  . Smoking status: Current Every Day Smoker    Packs/day: 3.00    Types: Cigarettes  . Smokeless tobacco: Never Used  . Alcohol use Yes    Review of Systems Constitutional: No fever/chills NWG:NFAOZHYQ sore throat. Cardiovascular: Denies chest pain. Respiratory: Denies shortness of breath. Gastrointestinal: No abdominal pain.  No nausea, no vomiting.   Musculoskeletal: Negative for back pain. Skin: Negative for rash. Neurological: Negative for headaches, focal weakness or numbness.  10-point ROS otherwise  negative.  ____________________________________________   PHYSICAL EXAM:  VITAL SIGNS: ED Triage Vitals  Enc Vitals Group     BP 10/03/15 1342 122/71     Pulse Rate 10/03/15 1342 81     Resp 10/03/15 1342 16     Temp 10/03/15 1342 98.7 F (37.1 C)     Temp Source 10/03/15 1342 Oral     SpO2 10/03/15 1342 99 %     Weight 10/03/15 1342 200 lb (90.7 kg)     Height 10/03/15 1342  (1.676 m)     Head Circumference --      Peak Flow --      Pain Score 10/03/15 1341 10     Pain Loc --      Pain Edu? --      Excl. in GC? --     Constitutional: Alert and oriented. Well appearing and in no acute distress. Eyes: Conjunctivae are normal. PERRL. EOMI. Head: Atraumatic. Nose: No congestion/rhinnorhea. Mouth/Throat: Mucous membranes are moist.  Oropharynx Slight erythema is present with some posterior drainage. Bilateral tonsils are slightly enlarged with the right being larger than the left and more erythematous. No exudate was seen. There is no shifting of the uvula. Patient is swallowing saliva and drink fluids without any difficulty. Neck: No stridor.   Hematological/Lymphatic/Immunilogical: Minimal bilateral cervical lymphadenopathy. Moderate tenderness. Cardiovascular: Normal rate, regular rhythm. Grossly normal heart sounds.  Good peripheral circulation. Respiratory: Normal respiratory effort.  No retractions. Lungs CTAB. Musculoskeletal: Moves upper and lower extremities without any difficulty and normal gait was noted. Neurologic: Speech is muffled. No gross focal neurologic deficits are appreciated. No gait instability. Skin:  Skin is warm,  dry and intact. No rash noted. Psychiatric: Mood and affect are normal. Speech and behavior are normal.  ____________________________________________   LABS (all labs ordered are listed, but only abnormal results are displayed)  Labs Reviewed  CBC WITH DIFFERENTIAL/PLATELET - Abnormal; Notable for the following:       Result Value    Hemoglobin 11.1 (*)    HCT 32.3 (*)    All other components within normal limits  COMPREHENSIVE METABOLIC PANEL - Abnormal; Notable for the following:    Calcium 8.8 (*)    All other components within normal limits  MONONUCLEOSIS SCREEN  POC URINE PREG, ED  POCT PREGNANCY, URINE    PROCEDURES  Procedure(s) performed: None  Procedures  Critical Care performed: No  ____________________________________________   INITIAL IMPRESSION / ASSESSMENT AND PLAN / ED COURSE  Pertinent labs & imaging results that were available during my care of the patient were reviewed by me and considered in my medical decision making (see chart for details).    Clinical Course  Patient was given Decadron 10 mg IV while in the emergency room along with 3 g of Unasyn IV. Patient was discharged on Augmentin 875 twice a day for 10 days along with Norco as needed for pain. Patient is to follow-up with Dr.Vaught at [facility] if any continued problems with her throat. Patient was drinking fluids while in the emergency room and had no difficulty swallowing.   ____________________________________________   FINAL CLINICAL IMPRESSION(S) / ED DIAGNOSES  Final diagnoses:  Peritonsillar cellulitis      NEW MEDICATIONS STARTED DURING THIS VISIT:  New Prescriptions   AMOXICILLIN-CLAVULANATE (AUGMENTIN) 875-125 MG TABLET    Take 1 tablet by mouth 2 (two) times daily.   HYDROCODONE-ACETAMINOPHEN (NORCO/VICODIN) 5-325 MG TABLET    Take 1 tablet by mouth every 4 (four) hours as needed for moderate pain.     Note:  This document was prepared using Dragon voice recognition software and may include unintentional dictation errors.    Tommi Rumps, PA-C 10/03/15 1647    Nita Sickle, MD 10/03/15 2213

## 2016-02-12 ENCOUNTER — Encounter: Payer: Self-pay | Admitting: Medical Oncology

## 2016-02-12 ENCOUNTER — Emergency Department
Admission: EM | Admit: 2016-02-12 | Discharge: 2016-02-12 | Disposition: A | Discharge status: Home / Self Care | Payer: Self-pay | Attending: Emergency Medicine | Admitting: Emergency Medicine

## 2016-02-12 DIAGNOSIS — F17210 Nicotine dependence, cigarettes, uncomplicated: Secondary | ICD-10-CM | POA: Insufficient documentation

## 2016-02-12 DIAGNOSIS — Z79899 Other long term (current) drug therapy: Secondary | ICD-10-CM | POA: Insufficient documentation

## 2016-02-12 DIAGNOSIS — J029 Acute pharyngitis, unspecified: Secondary | ICD-10-CM | POA: Insufficient documentation

## 2016-02-12 LAB — POCT RAPID STREP A: STREPTOCOCCUS, GROUP A SCREEN (DIRECT): NEGATIVE

## 2016-02-12 MED ORDER — FEXOFENADINE-PSEUDOEPHED ER 60-120 MG PO TB12: 1.0000 | ORAL_TABLET | Freq: Two times a day (BID) | ORAL | 0 refills | Status: DC

## 2016-02-12 MED ORDER — MAGIC MOUTHWASH W/LIDOCAINE: 5.0000 mL | Freq: Four times a day (QID) | ORAL | 0 refills | Status: DC

## 2016-02-12 NOTE — ED Notes (Signed)
See triage note  States she woke up with sore throat  Unsure of fever  Throat red and swollen

## 2016-02-12 NOTE — ED Triage Notes (Signed)
Pt reports she woke up this am with sore throat.

## 2016-02-12 NOTE — ED Provider Notes (Signed)
[facility] Emergency Department Provider Note   ____________________________________________   First MD Initiated Contact with Patient 02/12/16 [phone]     (approximate)  I have reviewed the triage vital signs and the nursing notes.   HISTORY  Chief Complaint Sore Throat    HPI Brittany Braun is a 20 y.o. female awakened this morning with sore throat. Patient denies any fever associated this complaint. Patient also complaining of bilateral ear pressure. Patient denies any fever or chills associated this complaint. Patient denies nausea vomiting or diarrhea. No palliative measures for this complaint. Patient rates the pain as a 9/10. Patient described a pain as "achy".  History reviewed. No pertinent past medical history.  There are no active problems to display for this patient.   No past surgical history on file.  Prior to Admission medications   Medication Sig Start Date End Date Taking? Authorizing Provider  fexofenadine-pseudoephedrine (ALLEGRA-D) 60-120 MG 12 hr tablet Take 1 tablet by mouth 2 (two) times daily. 02/12/16   Joni Reiningonald K Zacari Stiff, PA-C  HYDROcodone-acetaminophen (NORCO/VICODIN) 5-325 MG tablet Take 1 tablet by mouth every 4 (four) hours as needed for moderate pain. 10/03/15   Tommi Rumpshonda L Summers, PA-C  magic mouthwash w/lidocaine SOLN Take 5 mLs by mouth 4 (four) times daily. 02/12/16   Joni Reiningonald K Arleta Ostrum, PA-C    Allergies Patient has no known allergies.  No family history on file.  Social History Social History  Substance Use Topics  . Smoking status: Current Every Day Smoker    Packs/day: 3.00    Types: Cigarettes  . Smokeless tobacco: Never Used  . Alcohol use Yes    Review of Systems Constitutional: No fever/chills Eyes: No visual changes. ENT: Sore throat and ear pressure.  Cardiovascular: Denies chest pain. Respiratory: Denies shortness of breath. Gastrointestinal: No abdominal pain.  No nausea, no vomiting.  No diarrhea.  No  constipation. Genitourinary: Negative for dysuria. Musculoskeletal: Negative for back pain. Skin: Negative for rash. Neurological: Negative for headaches, focal weakness or numbness.    ____________________________________________   PHYSICAL EXAM:  VITAL SIGNS: ED Triage Vitals  Enc Vitals Group     BP 02/12/16 0901 120/70     Pulse Rate 02/12/16 0901 91     Resp 02/12/16 0901 16     Temp 02/12/16 0901 98.2 F (36.8 C)     Temp Source 02/12/16 0901 Oral     SpO2 02/12/16 0901 97 %     Weight 02/12/16 0855 222 lb (100.7 kg)     Height 02/12/16 0855 5\' 6"  (1.676 m)     Head Circumference --      Peak Flow --      Pain Score 02/12/16 0855 9     Pain Loc --      Pain Edu? --      Excl. in GC? --     Constitutional: Alert and oriented. Well appearing and in no acute distress. Eyes: Conjunctivae are normal. PERRL. EOMI. Head: Atraumatic. Nose: No congestion/rhinnorhea. Mouth/Throat: Mucous membranes are moist.  Oropharynx non-erythematous. Neck: No stridor.  No cervical spine tenderness to palpation. Hematological/Lymphatic/Immunilogical: No cervical lymphadenopathy. Cardiovascular: Normal rate, regular rhythm. Grossly normal heart sounds.  Good peripheral circulation. Respiratory: Normal respiratory effort.  No retractions. Lungs CTAB. Gastrointestinal: Soft and nontender. No distention. No abdominal bruits. No CVA tenderness. Musculoskeletal: No lower extremity tenderness nor edema.  No joint effusions. Neurologic:  Normal speech and language. No gross focal neurologic deficits are appreciated. No gait instability. Skin:  Skin is warm, dry and intact. No rash noted. Psychiatric: Mood and affect are normal. Speech and behavior are normal.  ____________________________________________   LABS (all labs ordered are listed, but only abnormal results are displayed)  Labs Reviewed  POCT RAPID STREP A    ____________________________________________  EKG   ____________________________________________  RADIOLOGY   ____________________________________________   PROCEDURES  Procedure(s) performed: None  Procedures  Critical Care performed: No  ____________________________________________   INITIAL IMPRESSION / ASSESSMENT AND PLAN / ED COURSE  Pertinent labs & imaging results that were available during my care of the patient were reviewed by me and considered in my medical decision making (see chart for details).  Viral pharyngitis. Discussed negative rapid strep test patient advised culture is pending. Patient given a prescription for Magic mouthwash and fexofenadine. Patient advised to [facility] if condition persists.  Clinical Course      ____________________________________________   FINAL CLINICAL IMPRESSION(S) / ED DIAGNOSES  Final diagnoses:  Viral pharyngitis      NEW MEDICATIONS STARTED DURING THIS VISIT:  New Prescriptions   FEXOFENADINE-PSEUDOEPHEDRINE (ALLEGRA-D) 60-120 MG 12 HR TABLET    Take 1 tablet by mouth 2 (two) times daily.   MAGIC MOUTHWASH W/LIDOCAINE SOLN    Take 5 mLs by mouth 4 (four) times daily.     Note:  This document was prepared using Dragon voice recognition software and may include unintentional dictation errors.    Joni Reiningonald K Blessed Cotham, PA-C 02/12/16 [id number]    Rockne MenghiniAnne-Caroline Norman, MD 02/12/16 [phone]

## 2016-03-20 ENCOUNTER — Encounter: Payer: Self-pay | Admitting: *Deleted

## 2016-03-20 ENCOUNTER — Emergency Department
Admission: EM | Admit: 2016-03-20 | Discharge: 2016-03-20 | Disposition: A | Discharge status: Home / Self Care | Payer: Self-pay | Attending: Emergency Medicine | Admitting: Emergency Medicine

## 2016-03-20 DIAGNOSIS — F17210 Nicotine dependence, cigarettes, uncomplicated: Secondary | ICD-10-CM | POA: Insufficient documentation

## 2016-03-20 DIAGNOSIS — J0100 Acute maxillary sinusitis, unspecified: Secondary | ICD-10-CM

## 2016-03-20 MED ORDER — IBUPROFEN 600 MG PO TABS: 600.0000 mg | ORAL_TABLET | Freq: Three times a day (TID) | ORAL | 0 refills | Status: DC | PRN

## 2016-03-20 MED ORDER — HYDROCOD POLST-CPM POLST ER 10-8 MG/5ML PO SUER
5.0000 mL | Freq: Once | ORAL | Status: AC
  Administered 2016-03-20: 5 mL via ORAL
  Filled 2016-03-20: qty 5

## 2016-03-20 MED ORDER — IBUPROFEN 600 MG PO TABS
600.0000 mg | ORAL_TABLET | Freq: Once | ORAL | Status: AC
  Administered 2016-03-20: 600 mg via ORAL
  Filled 2016-03-20: qty 1

## 2016-03-20 MED ORDER — AMOXICILLIN 500 MG PO CAPS: 500.0000 mg | ORAL_CAPSULE | Freq: Three times a day (TID) | ORAL | 0 refills | Status: DC

## 2016-03-20 MED ORDER — FEXOFENADINE-PSEUDOEPHED ER 60-120 MG PO TB12: 1.0000 | ORAL_TABLET | Freq: Two times a day (BID) | ORAL | 0 refills | Status: DC

## 2016-03-20 NOTE — ED Triage Notes (Signed)
States dry cough for several days, states nasal congestion, states headache that began this AM with chills

## 2016-03-20 NOTE — ED Provider Notes (Signed)
[facility] Emergency Department Provider Note   ____________________________________________   First MD Initiated Contact with Patient 03/20/16 1500     (approximate)  I have reviewed the triage vital signs and the nursing notes.   HISTORY  Chief Complaint Cough and Headache    HPI Brittany Braun is a 21 y.o. female . Patient complains several days of nasal congestion frontal headache fever and chills.Patient denies any nausea vomiting or diarrhea. Patient describes the pain is achy and rates it as a 10 over 10. No palliative measures taken for this complaint.   History reviewed. No pertinent past medical history.  There are no active problems to display for this patient.   History reviewed. No pertinent surgical history.  Prior to Admission medications   Medication Sig Start Date End Date Taking? Authorizing Provider  amoxicillin (AMOXIL) 500 MG capsule Take 1 capsule (500 mg total) by mouth 3 (three) times daily. 03/20/16   Joni Reining, PA-C  fexofenadine-pseudoephedrine (ALLEGRA-D) 60-120 MG 12 hr tablet Take 1 tablet by mouth 2 (two) times daily. 02/12/16   Joni Reining, PA-C  fexofenadine-pseudoephedrine (ALLEGRA-D) 60-120 MG 12 hr tablet Take 1 tablet by mouth 2 (two) times daily. 03/20/16   Joni Reining, PA-C  HYDROcodone-acetaminophen (NORCO/VICODIN) 5-325 MG tablet Take 1 tablet by mouth every 4 (four) hours as needed for moderate pain. 10/03/15   Tommi Rumps, PA-C  ibuprofen (ADVIL,MOTRIN) 600 MG tablet Take 1 tablet (600 mg total) by mouth every 8 (eight) hours as needed. 03/20/16   Joni Reining, PA-C  magic mouthwash w/lidocaine SOLN Take 5 mLs by mouth 4 (four) times daily. 02/12/16   Joni Reining, PA-C    Allergies Patient has no known allergies.  History reviewed. No pertinent family history.  Social History Social History  Substance Use Topics  . Smoking status: Current Every Day Smoker    Packs/day: 3.00   Types: Cigarettes  . Smokeless tobacco: Never Used  . Alcohol use Yes    Review of Systems Constitutional: No fever/chills Eyes: No visual changes. ENT: No sore throat.Nasal congestion Cardiovascular: Denies chest pain. Respiratory: Denies shortness of breath. Nonproductive cough Gastrointestinal: No abdominal pain.  No nausea, no vomiting.  No diarrhea.  No constipation. Genitourinary: Negative for dysuria. Musculoskeletal: Negative for back pain. Skin: Negative for rash. Neurological: Negative for headaches, focal weakness or numbness.    ____________________________________________   PHYSICAL EXAM:  VITAL SIGNS: ED Triage Vitals [03/20/16 1310]  Enc Vitals Group     BP 120/76     Pulse Rate 82     Resp 16     Temp 98.4 F (36.9 C)     Temp Source Oral     SpO2 99 %     Weight 222 lb (100.7 kg)     Height 5\' 6"  (1.676 m)     Head Circumference      Peak Flow      Pain Score 10     Pain Loc      Pain Edu?      Excl. in GC?     Constitutional: Alert and oriented. Well appearing and in no acute distress. Eyes: Conjunctivae are normal. PERRL. EOMI. Head: Atraumatic. Nose:Bilateral maxillary guarding. Edematous nasal turbinates with purulent discharge. Mouth/Throat: Mucous membranes are moist.  Oropharynx non-erythematous. Neck: No stridor.  No cervical spine tenderness to palpation. Hematological/Lymphatic/Immunilogical: No cervical lymphadenopathy. Cardiovascular: Normal rate, regular rhythm. Grossly normal heart sounds.  Good peripheral circulation. Respiratory:  Normal respiratory effort.  No retractions. Lungs CTAB. Nonproductive cough. Gastrointestinal: Soft and nontender. No distention. No abdominal bruits. No CVA tenderness. Musculoskeletal: No lower extremity tenderness nor edema.  No joint effusions. Neurologic:  Normal speech and language. No gross focal neurologic deficits are appreciated. No gait instability. Skin:  Skin is warm, dry and intact. No  rash noted. Psychiatric: Mood and affect are normal. Speech and behavior are normal.  ____________________________________________   LABS (all labs ordered are listed, but only abnormal results are displayed)  Labs Reviewed - No data to display ____________________________________________  EKG   ____________________________________________  RADIOLOGY   ____________________________________________   PROCEDURES  Procedure(s) performed: None  Procedures  Critical Care performed: No  ____________________________________________   INITIAL IMPRESSION / ASSESSMENT AND PLAN / ED COURSE  Pertinent labs & imaging results that were available during my care of the patient were reviewed by me and considered in my medical decision making (see chart for details).   Sinusitis. Patient given discharge care instructions. Patient given a prescription for Amoxil, Bromfed-DM, and ibuprofen. Patient given a work note. [facility]  Clinical Course      ____________________________________________   FINAL CLINICAL IMPRESSION(S) / ED DIAGNOSES  Final diagnoses:  Subacute maxillary sinusitis      NEW MEDICATIONS STARTED DURING THIS VISIT:  New Prescriptions   AMOXICILLIN (AMOXIL) 500 MG CAPSULE    Take 1 capsule (500 mg total) by mouth 3 (three) times daily.   FEXOFENADINE-PSEUDOEPHEDRINE (ALLEGRA-D) 60-120 MG 12 HR TABLET    Take 1 tablet by mouth 2 (two) times daily.   IBUPROFEN (ADVIL,MOTRIN) 600 MG TABLET    Take 1 tablet (600 mg total) by mouth every 8 (eight) hours as needed.     Note:  This document was prepared using Dragon voice recognition software and may include unintentional dictation errors.    Joni ReiningRonald K Smith, PA-C 03/20/16 1509    Nita Sicklearolina Veronese, MD 03/21/16 1500

## 2016-04-16 ENCOUNTER — Emergency Department
Admission: EM | Admit: 2016-04-16 | Discharge: 2016-04-16 | Disposition: A | Discharge status: Home / Self Care | Payer: Self-pay | Attending: Emergency Medicine | Admitting: Emergency Medicine

## 2016-04-16 ENCOUNTER — Encounter: Payer: Self-pay | Admitting: Emergency Medicine

## 2016-04-16 DIAGNOSIS — R112 Nausea with vomiting, unspecified: Secondary | ICD-10-CM | POA: Insufficient documentation

## 2016-04-16 DIAGNOSIS — F17210 Nicotine dependence, cigarettes, uncomplicated: Secondary | ICD-10-CM | POA: Insufficient documentation

## 2016-04-16 DIAGNOSIS — R197 Diarrhea, unspecified: Secondary | ICD-10-CM | POA: Insufficient documentation

## 2016-04-16 LAB — COMPREHENSIVE METABOLIC PANEL
ALBUMIN: 4.2 g/dL (ref 3.5–5.0)
ALK PHOS: 61 U/L (ref 38–126)
ALT: 11 U/L — AB (ref 14–54)
ANION GAP: 7 (ref 5–15)
AST: 18 U/L (ref 15–41)
BUN: 10 mg/dL (ref 6–20)
CALCIUM: 8.6 mg/dL — AB (ref 8.9–10.3)
CO2: 23 mmol/L (ref 22–32)
CREATININE: 0.64 mg/dL (ref 0.44–1.00)
Chloride: 106 mmol/L (ref 101–111)
GFR calc Af Amer: >60 mL/min (ref >60)
GFR calc non Af Amer: >60 mL/min (ref >60)
GLUCOSE: 98 mg/dL (ref 65–99)
Potassium: 3.5 mmol/L (ref 3.5–5.1)
SODIUM: 136 mmol/L (ref 135–145)
Total Bilirubin: 0.7 mg/dL (ref 0.3–1.2)
Total Protein: 8.1 g/dL (ref 6.5–8.1)

## 2016-04-16 LAB — URINALYSIS, COMPLETE (UACMP) WITH MICROSCOPIC
BACTERIA UA: NONE SEEN
Bilirubin Urine: NEGATIVE
GLUCOSE, UA: NEGATIVE mg/dL
Hgb urine dipstick: NEGATIVE
Ketones, ur: NEGATIVE mg/dL
Leukocytes, UA: NEGATIVE
Nitrite: NEGATIVE
PROTEIN: NEGATIVE mg/dL
Specific Gravity, Urine: 1.03 (ref 1.005–1.030)
Squamous Epithelial / LPF: NONE SEEN
pH: 5 (ref 5.0–8.0)

## 2016-04-16 LAB — CBC
HCT: 35.3 % (ref 35.0–47.0)
HEMOGLOBIN: 12.2 g/dL (ref 12.0–16.0)
MCH: 28.6 pg (ref 26.0–34.0)
MCHC: 34.5 g/dL (ref 32.0–36.0)
MCV: 82.7 fL (ref 80.0–100.0)
Platelets: 274 10*3/uL (ref 150–440)
RBC: 4.27 MIL/uL (ref 3.80–5.20)
RDW: 15.9 % — ABNORMAL HIGH (ref 11.5–14.5)
WBC: 9.8 10*3/uL (ref 3.6–11.0)

## 2016-04-16 LAB — POCT PREGNANCY, URINE: Preg Test, Ur: NEGATIVE

## 2016-04-16 LAB — LIPASE, BLOOD: Lipase: 15 U/L (ref 11–51)

## 2016-04-16 MED ORDER — ONDANSETRON 4 MG PO TBDP: 4.0000 mg | ORAL_TABLET | Freq: Three times a day (TID) | ORAL | 0 refills | Status: DC | PRN

## 2016-04-16 MED ORDER — MORPHINE SULFATE (PF) 4 MG/ML IV SOLN
4.0000 mg | Freq: Once | INTRAVENOUS | Status: AC
  Administered 2016-04-16: 4 mg via INTRAVENOUS
  Filled 2016-04-16: qty 1

## 2016-04-16 MED ORDER — DICYCLOMINE HCL 20 MG PO TABS: 20.0000 mg | ORAL_TABLET | Freq: Three times a day (TID) | ORAL | 0 refills | Status: DC | PRN

## 2016-04-16 MED ORDER — ONDANSETRON 4 MG PO TBDP
4.0000 mg | ORAL_TABLET | Freq: Once | ORAL | Status: AC | PRN
  Administered 2016-04-16: 4 mg via ORAL
  Filled 2016-04-16: qty 1

## 2016-04-16 MED ORDER — SODIUM CHLORIDE 0.9 % IV SOLN
Freq: Once | INTRAVENOUS | Status: AC
  Administered 2016-04-16: 09:00:00 via INTRAVENOUS

## 2016-04-16 MED ORDER — ONDANSETRON HCL 4 MG/2ML IJ SOLN
4.0000 mg | Freq: Once | INTRAMUSCULAR | Status: AC
  Administered 2016-04-16: 4 mg via INTRAVENOUS
  Filled 2016-04-16: qty 2

## 2016-04-16 NOTE — ED Triage Notes (Signed)
Pt ambulatory to triage with steady gait with c/o n/v/d since 5 PM yesterday. Pt reports emesis x 15 and diarrhea x 8. Pt denies blood in emesis. Pt reports this morning began to to have generalized abdominal pain. Pt denies fever at home or urinary symptoms.

## 2016-04-16 NOTE — ED Provider Notes (Signed)
[facility] Emergency Department Provider Note        Time seen: ----------------------------------------- 8:17 AM on 04/16/2016 -----------------------------------------    I have reviewed the triage vital signs and the nursing notes.   HISTORY  Chief Complaint Emesis and Diarrhea    HPI Brittany Braun is a 21 y.o. female presents to ER with nausea, vomiting and diarrhea since 5 PM yesterday. Patient reports she vomited around 15 times and has had diarrhea around 8 times. She denies any blood. She has had generalized abdominal pain that is worse on the left. She denies fevers, or other complaints.   History reviewed. No pertinent past medical history.  There are no active problems to display for this patient.   History reviewed. No pertinent surgical history.  Allergies Patient has no known allergies.  Social History Social History  Substance Use Topics  . Smoking status: Current Every Day Smoker    Packs/day: 3.00    Types: Cigarettes  . Smokeless tobacco: Never Used  . Alcohol use Yes    Review of Systems Constitutional: Negative for fever. Cardiovascular: Negative for chest pain. Respiratory: Negative for shortness of breath. Gastrointestinal: Positive for abdominal pain, vomiting and diarrhea Genitourinary: Negative for dysuria. Musculoskeletal: Negative for back pain. Skin: Negative for rash. Neurological: Negative for headaches, focal weakness or numbness.  10-point ROS otherwise negative.  ____________________________________________   PHYSICAL EXAM:  VITAL SIGNS: ED Triage Vitals  Enc Vitals Group     BP 04/16/16 0611 132/77     Pulse Rate 04/16/16 0611 86     Resp 04/16/16 0611 18     Temp 04/16/16 0611 98.7 F (37.1 C)     Temp Source 04/16/16 0611 Oral     SpO2 04/16/16 0611 99 %     Weight 04/16/16 0612 222 lb (100.7 kg)     Height 04/16/16 0612 5\' 6"  (1.676 m)     Head Circumference --      Peak Flow --       Pain Score 04/16/16 0612 10     Pain Loc --      Pain Edu? --      Excl. in GC? --     Constitutional: Alert and oriented. Well appearing and in no distress. Eyes: Conjunctivae are normal. PERRL. Normal extraocular movements. ENT   Head: Normocephalic and atraumatic.   Nose: No congestion/rhinnorhea.   Mouth/Throat: Mucous membranes are moist.   Neck: No stridor. Cardiovascular: Normal rate, regular rhythm. No murmurs, rubs, or gallops. Respiratory: Normal respiratory effort without tachypnea nor retractions. Breath sounds are clear and equal bilaterally. No wheezes/rales/rhonchi. Gastrointestinal: Soft and nontender. Normal bowel sounds Musculoskeletal: Nontender with normal range of motion in all extremities. No lower extremity tenderness nor edema. Neurologic:  Normal speech and language. No gross focal neurologic deficits are appreciated.  Skin:  Skin is warm, dry and intact. No rash noted. Psychiatric: Mood and affect are normal. Speech and behavior are normal.  ____________________________________________  ED COURSE:  Pertinent labs & imaging results that were available during my care of the patient were reviewed by me and considered in my medical decision making (see chart for details). Patient is in no acute distress, we will assess with labs and consider imaging. She will receive antiemetics and pain medicine.   Procedures ____________________________________________   LABS (pertinent positives/negatives)  Labs Reviewed  COMPREHENSIVE METABOLIC PANEL - Abnormal; Notable for the following:       Result Value   Calcium 8.6 (*)  ALT 11 (*)    All other components within normal limits  CBC - Abnormal; Notable for the following:    RDW 15.9 (*)    All other components within normal limits  URINALYSIS, COMPLETE (UACMP) WITH MICROSCOPIC - Abnormal; Notable for the following:    Color, Urine YELLOW (*)    APPearance HAZY (*)    All other components  within normal limits  LIPASE, BLOOD  POC URINE PREG, ED  POCT PREGNANCY, URINE   ____________________________________________  FINAL ASSESSMENT AND PLAN  Gastroenteritis  Plan: Patient with labs as dictated above. Patient presents to the ER in no distress with symptoms of gastroenteritis. Labs are reassuring. She is feeling better after pain medicines and antiemetics. She'll be discharged with antiemetics and close follow-up as needed.   Emily FilbertWilliams, Jonathan E, MD   Note: This note was generated in part or whole with voice recognition software. Voice recognition is usually quite accurate but there are transcription errors that can and very often do occur. I apologize for any typographical errors that were not detected and corrected.     Emily FilbertJonathan E Williams, MD 04/16/16 [id number]

## 2016-07-03 ENCOUNTER — Encounter: Payer: Self-pay | Admitting: *Deleted

## 2016-07-03 ENCOUNTER — Emergency Department
Admission: EM | Admit: 2016-07-03 | Discharge: 2016-07-03 | Disposition: A | Discharge status: Home / Self Care | Payer: Self-pay | Attending: Emergency Medicine | Admitting: Emergency Medicine

## 2016-07-03 DIAGNOSIS — K529 Noninfective gastroenteritis and colitis, unspecified: Secondary | ICD-10-CM | POA: Insufficient documentation

## 2016-07-03 DIAGNOSIS — F17210 Nicotine dependence, cigarettes, uncomplicated: Secondary | ICD-10-CM | POA: Insufficient documentation

## 2016-07-03 DIAGNOSIS — R1084 Generalized abdominal pain: Secondary | ICD-10-CM

## 2016-07-03 DIAGNOSIS — R112 Nausea with vomiting, unspecified: Secondary | ICD-10-CM

## 2016-07-03 LAB — CBC
HCT: 37 % (ref 35.0–47.0)
Hemoglobin: 12.1 g/dL (ref 12.0–16.0)
MCH: 27.5 pg (ref 26.0–34.0)
MCHC: 32.6 g/dL (ref 32.0–36.0)
MCV: 84.4 fL (ref 80.0–100.0)
PLATELETS: 290 10*3/uL (ref 150–440)
RBC: 4.38 MIL/uL (ref 3.80–5.20)
RDW: 15 % — AB (ref 11.5–14.5)
WBC: 5.1 10*3/uL (ref 3.6–11.0)

## 2016-07-03 LAB — URINALYSIS, COMPLETE (UACMP) WITH MICROSCOPIC
Bacteria, UA: NONE SEEN
Bilirubin Urine: NEGATIVE
GLUCOSE, UA: NEGATIVE mg/dL
Ketones, ur: NEGATIVE mg/dL
LEUKOCYTES UA: NEGATIVE
NITRITE: NEGATIVE
PROTEIN: 30 mg/dL — AB
SPECIFIC GRAVITY, URINE: 1.016 (ref 1.005–1.030)
pH: 8 (ref 5.0–8.0)

## 2016-07-03 LAB — COMPREHENSIVE METABOLIC PANEL
ALT: 10 U/L — AB (ref 14–54)
AST: 19 U/L (ref 15–41)
Albumin: 4.2 g/dL (ref 3.5–5.0)
Alkaline Phosphatase: 58 U/L (ref 38–126)
Anion gap: 5 (ref 5–15)
BILIRUBIN TOTAL: 0.4 mg/dL (ref 0.3–1.2)
BUN: 9 mg/dL (ref 6–20)
CO2: 28 mmol/L (ref 22–32)
CREATININE: 0.71 mg/dL (ref 0.44–1.00)
Calcium: 9 mg/dL (ref 8.9–10.3)
Chloride: 105 mmol/L (ref 101–111)
Glucose, Bld: 88 mg/dL (ref 65–99)
POTASSIUM: 4 mmol/L (ref 3.5–5.1)
Sodium: 138 mmol/L (ref 135–145)
TOTAL PROTEIN: 7.9 g/dL (ref 6.5–8.1)

## 2016-07-03 LAB — POCT PREGNANCY, URINE: PREG TEST UR: NEGATIVE

## 2016-07-03 LAB — LIPASE, BLOOD: LIPASE: 15 U/L (ref 11–51)

## 2016-07-03 MED ORDER — ONDANSETRON 4 MG PO TBDP: 4.0000 mg | ORAL_TABLET | Freq: Three times a day (TID) | ORAL | 0 refills | Status: DC | PRN

## 2016-07-03 MED ORDER — NAPROXEN 500 MG PO TABS: 500.0000 mg | ORAL_TABLET | Freq: Two times a day (BID) | ORAL | 0 refills | Status: DC

## 2016-07-03 MED ORDER — FAMOTIDINE 20 MG PO TABS: 20.0000 mg | ORAL_TABLET | Freq: Two times a day (BID) | ORAL | 0 refills | Status: DC

## 2016-07-03 NOTE — ED Notes (Signed)
Pt verbalized understanding of discharge instructions. NAD at this time. 

## 2016-07-03 NOTE — ED Provider Notes (Signed)
[facility] Emergency Department Provider Note  ____________________________________________  Time seen: Approximately 1:34 PM  I have reviewed the triage vital signs and the nursing notes.   HISTORY  Chief Complaint Abdominal Pain and Emesis    HPI Jessah R Sonneborn is a 21 y.o. female who complains of generalized abdominal pain with nausea and vomiting for the past 3 days. No fever but does have chills. Also has rhinorrhea and nonproductive cough. 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 No diarrhea. No prior surgeries, no trauma.  Eating and drinking normally.     History reviewed. No pertinent past medical history.   There are no active problems to display for this patient.    History reviewed. No pertinent surgical history.   Prior to Admission medications   Medication Sig Start Date End Date Taking? Authorizing Provider  amoxicillin (AMOXIL) 500 MG capsule Take 1 capsule (500 mg total) by mouth 3 (three) times daily. 03/20/16   Joni Reining, PA-C  dicyclomine (BENTYL) 20 MG tablet Take 1 tablet (20 mg total) by mouth 3 (three) times daily as needed for spasms. 04/16/16   Emily Filbert, MD  dicyclomine (BENTYL) 20 MG tablet Take 1 tablet (20 mg total) by mouth 3 (three) times daily as needed for spasms. 04/16/16   Emily Filbert, MD  famotidine (PEPCID) 20 MG tablet Take 1 tablet (20 mg total) by mouth 2 (two) times daily. 07/03/16   Sharman Cheek, MD  fexofenadine-pseudoephedrine (ALLEGRA-D) 60-120 MG 12 hr tablet Take 1 tablet by mouth 2 (two) times daily. 02/12/16   Joni Reining, PA-C  fexofenadine-pseudoephedrine (ALLEGRA-D) 60-120 MG 12 hr tablet Take 1 tablet by mouth 2 (two) times daily. 03/20/16   Joni Reining, PA-C  HYDROcodone-acetaminophen (NORCO/VICODIN) 5-325 MG tablet Take 1 tablet by mouth every 4 (four) hours as needed for moderate pain. 10/03/15   Tommi Rumps, PA-C  ibuprofen (ADVIL,MOTRIN)  600 MG tablet Take 1 tablet (600 mg total) by mouth every 8 (eight) hours as needed. 03/20/16   Joni Reining, PA-C  magic mouthwash w/lidocaine SOLN Take 5 mLs by mouth 4 (four) times daily. 02/12/16   Joni Reining, PA-C  naproxen (NAPROSYN) 500 MG tablet Take 1 tablet (500 mg total) by mouth 2 (two) times daily with a meal. 07/03/16   Sharman Cheek, MD  ondansetron (ZOFRAN ODT) 4 MG disintegrating tablet Take 1 tablet (4 mg total) by mouth every 8 (eight) hours as needed for nausea or vomiting. 07/03/16   Sharman Cheek, MD     Allergies Patient has no known allergies.   No family history on file.  Social History Social History  Substance Use Topics  . Smoking status: Current Every Day Smoker    Packs/day: 3.00    Types: Cigarettes  . Smokeless tobacco: Never Used  . Alcohol use Yes    Review of Systems  Constitutional:   No fever positivechills.  ENT:   No sore throat. positiverhinorrhea. Lymphatic: No swollen glands, No extremity swelling Endocrine: No hot/cold flashes. No significant weight change. No neck swelling. Cardiovascular:   No chest pain or syncope. Respiratory:   No dyspnea positive nonproductivecough. Gastrointestinal:   positive generalized abdominal pain with vomiting. No diarrhea or constipation..  Genitourinary:   Negative for dysuria or difficulty urinating. Musculoskeletal:   Negative for focal pain or swelling Neurological:   Negative for headaches or weakness. All other systems reviewed and are negative except as  documented above in ROS and HPI.  ____________________________________________   PHYSICAL EXAM:  VITAL SIGNS: ED Triage Vitals  Enc Vitals Group     BP 07/03/16 0958 116/60     Pulse Rate 07/03/16 0958 61     Resp 07/03/16 0958 16     Temp 07/03/16 0958 98.9 F (37.2 C)     Temp Source 07/03/16 0958 Oral     SpO2 07/03/16 0958 98 %     Weight 07/03/16 0958 222 lb (100.7 kg)     Height 07/03/16 0958  (1.676 m)     Head  Circumference --      Peak Flow --      Pain Score 07/03/16 0949 10     Pain Loc --      Pain Edu? --      Excl. in GC? --     Vital signs reviewed, nursing assessments reviewed.   Constitutional:   Alert and oriented. Well appearing and in no distress. Eyes:   No scleral icterus. No conjunctival pallor. PERRL. EOMI.  No nystagmus. ENT   Head:   Normocephalic and atraumatic.   Nose:   Positive nasal congestion. No septal hematoma   Mouth/Throat:   MMM, no pharyngeal erythema. No peritonsillar mass.    Neck:   No stridor. No SubQ emphysema. No meningismus. Hematological/Lymphatic/Immunilogical:   No cervical lymphadenopathy. Cardiovascular:   RRR. Symmetric bilateral radial and DP pulses.  No murmurs.  Respiratory:   Normal respiratory effort without tachypnea nor retractions. Breath sounds are clear and equal bilaterally. No wheezes/rales/rhonchi. Gastrointestinal:   Soft and nontender. Non distended. There is no CVA tenderness.  No rebound, rigidity, or guarding. Genitourinary:   deferred Musculoskeletal:   Normal range of motion in all extremities. No joint effusions.  No lower extremity tenderness.  No edema. Neurologic:   Normal speech and language.  CN 2-10 normal. Motor grossly intact. No gross focal neurologic deficits are appreciated.  Skin:    Skin is warm, dry and intact. No rash noted.  No petechiae, purpura, or bullae.  ____________________________________________    LABS (pertinent positives/negatives) (all labs ordered are listed, but only abnormal results are displayed) Labs Reviewed  COMPREHENSIVE METABOLIC PANEL - Abnormal; Notable for the following:       Result Value   ALT 10 (*)    All other components within normal limits  CBC - Abnormal; Notable for the following:    RDW 15.0 (*)    All other components within normal limits  URINALYSIS, COMPLETE (UACMP) WITH MICROSCOPIC - Abnormal; Notable for the following:    Color, Urine YELLOW (*)     APPearance HAZY (*)    Hgb urine dipstick LARGE (*)    Protein, ur 30 (*)    Squamous Epithelial / LPF 6-30 (*)    All other components within normal limits  LIPASE, BLOOD  POCT PREGNANCY, URINE   ____________________________________________   EKG    ____________________________________________    RADIOLOGY  No results found.  ____________________________________________   PROCEDURES Procedures  ____________________________________________   INITIAL IMPRESSION / ASSESSMENT AND PLAN / ED COURSE  Pertinent labs & imaging results that were available during my care of the patient were reviewed by me and considered in my medical decision making (see chart for details).  Patient well appearing no acute distress, presents with generalized abdominal pain with vomiting. Also has evidence of upper respiratory symptoms. Consistent with a viral syndrome, likely early gastroenteritis. Counseled patient on likely diarrhea in the  next few days. Oral hydration. He'll start her on Pepcid naproxen and Zofran for symptom control. Return precautions given, otherwise follow up with primary care. Work note provided. Considering the patient's symptoms, medical history, and physical examination today, I have low suspicion for cholecystitis or biliary pathology, pancreatitis, perforation or bowel obstruction, hernia, intra-abdominal abscess, AAA or dissection, volvulus or intussusception, mesenteric ischemia, or appendicitis.           ____________________________________________   FINAL CLINICAL IMPRESSION(S) / ED DIAGNOSES  Final diagnoses:  Gastroenteritis  Non-intractable vomiting with nausea, unspecified vomiting type  Generalized abdominal pain      New Prescriptions   FAMOTIDINE (PEPCID) 20 MG TABLET    Take 1 tablet (20 mg total) by mouth 2 (two) times daily.   NAPROXEN (NAPROSYN) 500 MG TABLET    Take 1 tablet (500 mg total) by mouth 2 (two) times daily with a meal.    ONDANSETRON (ZOFRAN ODT) 4 MG DISINTEGRATING TABLET    Take 1 tablet (4 mg total) by mouth every 8 (eight) hours as needed for nausea or vomiting.     Portions of this note were generated with dragon dictation software. Dictation errors may occur despite best attempts at proofreading.    Sharman Cheek, MD 07/03/16 1343

## 2016-07-03 NOTE — ED Triage Notes (Signed)
Pt complains of abdominal pain with nausea and vomiting starting on Monday, pt denies fever

## 2016-11-14 ENCOUNTER — Emergency Department
Admission: EM | Admit: 2016-11-14 | Discharge: 2016-11-14 | Disposition: A | Discharge status: Home / Self Care | Payer: Self-pay | Attending: Emergency Medicine | Admitting: Emergency Medicine

## 2016-11-14 ENCOUNTER — Emergency Department: Payer: Self-pay

## 2016-11-14 ENCOUNTER — Encounter: Payer: Self-pay | Admitting: *Deleted

## 2016-11-14 DIAGNOSIS — F17210 Nicotine dependence, cigarettes, uncomplicated: Secondary | ICD-10-CM | POA: Insufficient documentation

## 2016-11-14 DIAGNOSIS — R079 Chest pain, unspecified: Secondary | ICD-10-CM | POA: Insufficient documentation

## 2016-11-14 DIAGNOSIS — J209 Acute bronchitis, unspecified: Secondary | ICD-10-CM | POA: Insufficient documentation

## 2016-11-14 LAB — TROPONIN I

## 2016-11-14 LAB — BASIC METABOLIC PANEL
Anion gap: 6 (ref 5–15)
BUN: 10 mg/dL (ref 6–20)
CALCIUM: 9 mg/dL (ref 8.9–10.3)
CHLORIDE: 105 mmol/L (ref 101–111)
CO2: 26 mmol/L (ref 22–32)
Creatinine, Ser: 0.67 mg/dL (ref 0.44–1.00)
GFR calc non Af Amer: >60 mL/min (ref >60)
Glucose, Bld: 86 mg/dL (ref 65–99)
POTASSIUM: 3.4 mmol/L — AB (ref 3.5–5.1)
Sodium: 137 mmol/L (ref 135–145)

## 2016-11-14 LAB — CBC
HEMATOCRIT: 34.2 % — AB (ref 35.0–47.0)
HEMOGLOBIN: 11.7 g/dL — AB (ref 12.0–16.0)
MCH: 28.2 pg (ref 26.0–34.0)
MCHC: 34.2 g/dL (ref 32.0–36.0)
MCV: 82.2 fL (ref 80.0–100.0)
Platelets: 297 10*3/uL (ref 150–440)
RBC: 4.16 MIL/uL (ref 3.80–5.20)
RDW: 13.6 % (ref 11.5–14.5)
WBC: 6.3 10*3/uL (ref 3.6–11.0)

## 2016-11-14 MED ORDER — PREDNISONE 20 MG PO TABS
40.0000 mg | ORAL_TABLET | Freq: Every day | ORAL | 0 refills | Status: DC
Start: 2016-11-14 — End: 2018-03-25

## 2016-11-14 MED ORDER — NAPROXEN 500 MG PO TABS: 500.0000 mg | ORAL_TABLET | Freq: Two times a day (BID) | ORAL | 0 refills | Status: DC

## 2016-11-14 MED ORDER — PREDNISONE 20 MG PO TABS
40.0000 mg | ORAL_TABLET | ORAL | Status: AC
  Administered 2016-11-14: 40 mg via ORAL
  Filled 2016-11-14: qty 2

## 2016-11-14 MED ORDER — KETOROLAC TROMETHAMINE 30 MG/ML IJ SOLN
15.0000 mg | INTRAMUSCULAR | Status: AC
  Administered 2016-11-14: 15 mg via INTRAVENOUS
  Filled 2016-11-14: qty 1

## 2016-11-14 MED ORDER — IPRATROPIUM-ALBUTEROL 0.5-2.5 (3) MG/3ML IN SOLN
3.0000 mL | Freq: Once | RESPIRATORY_TRACT | Status: AC
  Administered 2016-11-14: 3 mL via RESPIRATORY_TRACT
  Filled 2016-11-14: qty 3

## 2016-11-14 NOTE — ED Notes (Signed)
Pt discharged to home.  Family member driving.  Discharge instructions reviewed.  Verbalized understanding.  No questions or concerns at this time.  Teach back verified.  Pt in NAD.  No items left in ED.   

## 2016-11-14 NOTE — ED Triage Notes (Signed)
States mid chest pain that goes to her back that began a few hours pta, states feeling dizzy, states pain in "pinching" in nature, awake and alert in no acute distress

## 2016-11-14 NOTE — ED Provider Notes (Signed)
[name] Emergency Department Provider Note  ____________________________________________  Time seen: Approximately 12:09 PM  I have reviewed the triage vital signs and the nursing notes.   HISTORY  Chief Complaint Chest Pain    HPI Brittany Braun is a 21 y.o. female who complains of mid chest pain in the center of the chest, radiating to the back, described as pinching. Not exertional nor pleuritic. Denies shortness of breath. 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 Denies vomiting or diarrhea but does have body aches. No aggravating or alleviating factors. Pain is mild to moderate.     History reviewed. No pertinent past medical history.   There are no active problems to display for this patient.    History reviewed. No pertinent surgical history.   Prior to Admission medications   Medication Sig Start Date End Date Taking? Authorizing Provider  dicyclomine (BENTYL) 20 MG tablet Take 1 tablet (20 mg total) by mouth 3 (three) times daily as needed for spasms. Patient not taking: Reported on 11/14/2016 04/16/16   Emily FilbertWilliams, Jonathan E, MD  dicyclomine (BENTYL) 20 MG tablet Take 1 tablet (20 mg total) by mouth 3 (three) times daily as needed for spasms. Patient not taking: Reported on 11/14/2016 04/16/16   Emily FilbertWilliams, Jonathan E, MD  famotidine (PEPCID) 20 MG tablet Take 1 tablet (20 mg total) by mouth 2 (two) times daily. Patient not taking: Reported on 11/14/2016 07/03/16   Sharman CheekStafford, Aireanna Luellen, MD  fexofenadine-pseudoephedrine (ALLEGRA-D) 60-120 MG 12 hr tablet Take 1 tablet by mouth 2 (two) times daily. Patient not taking: Reported on 11/14/2016 02/12/16   Joni ReiningSmith, Ronald K, PA-C  fexofenadine-pseudoephedrine (ALLEGRA-D) 60-120 MG 12 hr tablet Take 1 tablet by mouth 2 (two) times daily. Patient not taking: Reported on 11/14/2016 03/20/16   Joni ReiningSmith, Ronald K, PA-C  HYDROcodone-acetaminophen  (NORCO/VICODIN) 5-325 MG tablet Take 1 tablet by mouth every 4 (four) hours as needed for moderate pain. Patient not taking: Reported on 11/14/2016 10/03/15   Tommi RumpsSummers, Rhonda L, PA-C  ibuprofen (ADVIL,MOTRIN) 600 MG tablet Take 1 tablet (600 mg total) by mouth every 8 (eight) hours as needed. Patient not taking: Reported on 11/14/2016 03/20/16   Joni ReiningSmith, Ronald K, PA-C  magic mouthwash w/lidocaine SOLN Take 5 mLs by mouth 4 (four) times daily. Patient not taking: Reported on 11/14/2016 02/12/16   Joni ReiningSmith, Ronald K, PA-C  naproxen (NAPROSYN) 500 MG tablet Take 1 tablet (500 mg total) by mouth 2 (two) times daily with a meal. Patient not taking: Reported on 11/14/2016 07/03/16   Sharman CheekStafford, Lyrick Lagrand, MD  naproxen (NAPROSYN) 500 MG tablet Take 1 tablet (500 mg total) by mouth 2 (two) times daily with a meal. 11/14/16   Sharman CheekStafford, Damare Serano, MD  ondansetron (ZOFRAN ODT) 4 MG disintegrating tablet Take 1 tablet (4 mg total) by mouth every 8 (eight) hours as needed for nausea or vomiting. Patient not taking: Reported on 11/14/2016 07/03/16   Sharman CheekStafford, Nyashia Raney, MD  predniSONE (DELTASONE) 20 MG tablet Take 2 tablets (40 mg total) by mouth daily. 11/14/16   Sharman CheekStafford, Jowanda Heeg, MD     Allergies Patient has no known allergies.   History reviewed. No pertinent family history.  Social History Social History  Substance Use Topics  . Smoking status: Current Every Day Smoker    Packs/day: 3.00    Types: Cigarettes  . Smokeless tobacco: Never Used  . Alcohol use Yes    Review of Systems  Constitutional:  No fever or chills.  ENT:   No sore throat. No rhinorrhea. Cardiovascular:   Positive as above chest pain. No syncope. Respiratory:  Negative for shortness of breath, positive nonproductive cough. Gastrointestinal:   Negative for abdominal pain, vomiting and diarrhea.  Musculoskeletal:   Negative for focal pain or swelling. Positive body aches All other systems reviewed and are negative except as documented above in ROS  and HPI.  ____________________________________________   PHYSICAL EXAM:  VITAL SIGNS: ED Triage Vitals  Enc Vitals Group     BP 11/14/16 0910 109/60     Pulse Rate 11/14/16 0910 65     Resp 11/14/16 0910 18     Temp --      Temp src --      SpO2 11/14/16 0930 100 %     Weight 11/14/16 0910 222 lb (100.7 kg)     Height 11/14/16 0910  (1.676 m)     Head Circumference --      Peak Flow --      Pain Score 11/14/16 0952 9     Pain Loc --      Pain Edu? --      Excl. in GC? --     Vital signs reviewed, nursing assessments reviewed.   Constitutional:   Alert and oriented. Well appearing and in no distress. Eyes:   No scleral icterus.  EOMI. No nystagmus. No conjunctival pallor. PERRL. ENT   Head:   Normocephalic and atraumatic.   Nose:   No congestion/rhinnorhea.    Mouth/Throat:   MMM, no pharyngeal erythema. No peritonsillar mass.    Neck:   No meningismus. Full ROM Hematological/Lymphatic/Immunilogical:   No cervical lymphadenopathy. Cardiovascular:   RRR. Symmetric bilateral radial and DP pulses.  No murmurs.  Respiratory:   Normal respiratory effort without tachypnea/retractions. Breath sounds are clear and equal bilaterally. Slight expiratory wheezing. Gastrointestinal:   Soft and nontender. Non distended. There is no CVA tenderness.  No rebound, rigidity, or guarding. Genitourinary:   deferred Musculoskeletal:   Normal range of motion in all extremities. No joint effusions.  No lower extremity tenderness.  No edema. Neurologic:   Normal speech and language.  Motor grossly intact. No gross focal neurologic deficits are appreciated.  Skin:    Skin is warm, dry and intact. No rash noted.  No petechiae, purpura, or bullae.  ____________________________________________    LABS (pertinent positives/negatives) (all labs ordered are listed, but only abnormal results are displayed) Labs Reviewed  BASIC METABOLIC PANEL - Abnormal; Notable for the  following:       Result Value   Potassium 3.4 (*)    All other components within normal limits  CBC - Abnormal; Notable for the following:    Hemoglobin 11.7 (*)    HCT 34.2 (*)    All other components within normal limits  TROPONIN I   ____________________________________________   EKG  Interpreted by me  Date: 11/14/2016  Rate: 63  Rhythm: normal sinus rhythm  QRS Axis: normal  Intervals: normal  ST/T Wave abnormalities: normal  Conduction Disutrbances: none  Narrative Interpretation: unremarkable      ____________________________________________    RADIOLOGY  Dg Chest 2 View  Result Date: 11/14/2016 CLINICAL DATA:  Chest pain. EXAM: CHEST  2 VIEW COMPARISON:  None. FINDINGS: The heart size and mediastinal contours are within normal limits. Both lungs are clear. The visualized skeletal structures are unremarkable. IMPRESSION: Normal chest x-ray. Electronically Signed   By: Vickki Hearing.D.  On: 11/14/2016 10:01    ____________________________________________   PROCEDURES Procedures  ____________________________________________   INITIAL IMPRESSION / ASSESSMENT AND PLAN / ED COURSE  Pertinent labs & imaging results that were available during my care of the patient were reviewed by me and considered in my medical decision making (see chart for details).  Patient well appearing no acute distress, presents with low risk atypical chest pain.Considering the patient's symptoms, medical history, and physical examination today, I have low suspicion for ACS, PE, TAD, pneumothorax, carditis, mediastinitis, pneumonia, CHF, or sepsis.  Treat patient with NSAIDs prednisone, DuoNeb for what appears to be clinically acute bronchitis. 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      ____________________________________________   FINAL CLINICAL IMPRESSION(S) / ED DIAGNOSES  Final diagnoses:  Nonspecific  chest pain  Acute bronchitis, unspecified organism      New Prescriptions   NAPROXEN (NAPROSYN) 500 MG TABLET    Take 1 tablet (500 mg total) by mouth 2 (two) times daily with a meal.   PREDNISONE (DELTASONE) 20 MG TABLET    Take 2 tablets (40 mg total) by mouth daily.     Portions of this note were generated with dragon dictation software. Dictation errors may occur despite best attempts at proofreading.    Sharman Cheek, MD 11/14/16 [phone]

## 2017-01-09 ENCOUNTER — Emergency Department
Admission: EM | Admit: 2017-01-09 | Discharge: 2017-01-09 | Disposition: A | Discharge status: Home / Self Care | Payer: Self-pay | Attending: Emergency Medicine | Admitting: Emergency Medicine

## 2017-01-09 ENCOUNTER — Encounter: Payer: Self-pay | Admitting: Emergency Medicine

## 2017-01-09 ENCOUNTER — Emergency Department: Payer: Self-pay

## 2017-01-09 DIAGNOSIS — S060X9A Concussion with loss of consciousness of unspecified duration, initial encounter: Secondary | ICD-10-CM | POA: Insufficient documentation

## 2017-01-09 DIAGNOSIS — R55 Syncope and collapse: Secondary | ICD-10-CM

## 2017-01-09 DIAGNOSIS — Y9389 Activity, other specified: Secondary | ICD-10-CM | POA: Insufficient documentation

## 2017-01-09 DIAGNOSIS — S060X1A Concussion with loss of consciousness of 30 minutes or less, initial encounter: Secondary | ICD-10-CM

## 2017-01-09 DIAGNOSIS — Y92012 Bathroom of single-family (private) house as the place of occurrence of the external cause: Secondary | ICD-10-CM | POA: Insufficient documentation

## 2017-01-09 DIAGNOSIS — W19XXXA Unspecified fall, initial encounter: Secondary | ICD-10-CM | POA: Insufficient documentation

## 2017-01-09 DIAGNOSIS — F17210 Nicotine dependence, cigarettes, uncomplicated: Secondary | ICD-10-CM | POA: Insufficient documentation

## 2017-01-09 DIAGNOSIS — Y999 Unspecified external cause status: Secondary | ICD-10-CM | POA: Insufficient documentation

## 2017-01-09 LAB — CBC
HEMATOCRIT: 34.6 % — AB (ref 35.0–47.0)
Hemoglobin: 11.4 g/dL — ABNORMAL LOW (ref 12.0–16.0)
MCH: 27.8 pg (ref 26.0–34.0)
MCHC: 33 g/dL (ref 32.0–36.0)
MCV: 84.2 fL (ref 80.0–100.0)
Platelets: 263 10*3/uL (ref 150–440)
RBC: 4.11 MIL/uL (ref 3.80–5.20)
RDW: 14.4 % (ref 11.5–14.5)
WBC: 5 10*3/uL (ref 3.6–11.0)

## 2017-01-09 LAB — URINALYSIS, COMPLETE (UACMP) WITH MICROSCOPIC
BACTERIA UA: NONE SEEN
BILIRUBIN URINE: NEGATIVE
Glucose, UA: NEGATIVE mg/dL
Hgb urine dipstick: NEGATIVE
KETONES UR: NEGATIVE mg/dL
LEUKOCYTES UA: NEGATIVE
Nitrite: NEGATIVE
PROTEIN: NEGATIVE mg/dL
SPECIFIC GRAVITY, URINE: 1.021 (ref 1.005–1.030)
pH: 7 (ref 5.0–8.0)

## 2017-01-09 LAB — BASIC METABOLIC PANEL
ANION GAP: 4 — AB (ref 5–15)
BUN: 9 mg/dL (ref 6–20)
CALCIUM: 9 mg/dL (ref 8.9–10.3)
CO2: 26 mmol/L (ref 22–32)
Chloride: 107 mmol/L (ref 101–111)
Creatinine, Ser: 0.75 mg/dL (ref 0.44–1.00)
GFR calc Af Amer: >60 mL/min (ref >60)
GFR calc non Af Amer: >60 mL/min (ref >60)
GLUCOSE: 82 mg/dL (ref 65–99)
Potassium: 4 mmol/L (ref 3.5–5.1)
Sodium: 137 mmol/L (ref 135–145)

## 2017-01-09 LAB — GLUCOSE, CAPILLARY: GLUCOSE-CAPILLARY: 82 mg/dL (ref 65–99)

## 2017-01-09 LAB — POCT PREGNANCY, URINE: Preg Test, Ur: NEGATIVE

## 2017-01-09 MED ORDER — ACETAMINOPHEN 500 MG PO TABS
1000.0000 mg | ORAL_TABLET | Freq: Once | ORAL | Status: AC
  Administered 2017-01-09: 1000 mg via ORAL
  Filled 2017-01-09: qty 2
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
## 2017-01-09 NOTE — ED Notes (Signed)
Pt ambulatory to wheelchair upon discharge. Pt and family verbalized understanding of discharge instructions and follow-up care. VSS. Pt A&O x4. Skin warm and dry.
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
## 2017-07-03 ENCOUNTER — Emergency Department
Admission: EM | Admit: 2017-07-03 | Discharge: 2017-07-03 | Disposition: A | Discharge status: Home / Self Care | Payer: Self-pay | Attending: Emergency Medicine | Admitting: Emergency Medicine

## 2017-07-03 ENCOUNTER — Other Ambulatory Visit: Payer: Self-pay

## 2017-07-03 DIAGNOSIS — R112 Nausea with vomiting, unspecified: Secondary | ICD-10-CM | POA: Insufficient documentation

## 2017-07-03 DIAGNOSIS — R197 Diarrhea, unspecified: Secondary | ICD-10-CM | POA: Insufficient documentation

## 2017-07-03 DIAGNOSIS — F17210 Nicotine dependence, cigarettes, uncomplicated: Secondary | ICD-10-CM | POA: Insufficient documentation

## 2017-07-03 DIAGNOSIS — R1084 Generalized abdominal pain: Secondary | ICD-10-CM | POA: Insufficient documentation

## 2017-07-03 LAB — CBC WITH DIFFERENTIAL/PLATELET
BASOS ABS: 0 10*3/uL (ref 0–0.1)
BASOS PCT: 0 %
EOS ABS: 0 10*3/uL (ref 0–0.7)
EOS PCT: 0 %
HEMATOCRIT: 37.5 % (ref 35.0–47.0)
Hemoglobin: 12.5 g/dL (ref 12.0–16.0)
Lymphocytes Relative: 5 %
Lymphs Abs: 0.5 10*3/uL — ABNORMAL LOW (ref 1.0–3.6)
MCH: 27.7 pg (ref 26.0–34.0)
MCHC: 33.4 g/dL (ref 32.0–36.0)
MCV: 83.1 fL (ref 80.0–100.0)
MONO ABS: 0.5 10*3/uL (ref 0.2–0.9)
MONOS PCT: 5 %
NEUTROS ABS: 8.8 10*3/uL — AB (ref 1.4–6.5)
Neutrophils Relative %: 90 %
PLATELETS: 293 10*3/uL (ref 150–440)
RBC: 4.51 MIL/uL (ref 3.80–5.20)
RDW: 14.8 % — AB (ref 11.5–14.5)
WBC: 9.8 10*3/uL (ref 3.6–11.0)

## 2017-07-03 LAB — COMPREHENSIVE METABOLIC PANEL
ALT: 12 U/L — ABNORMAL LOW (ref 14–54)
ANION GAP: 6 (ref 5–15)
AST: 20 U/L (ref 15–41)
Albumin: 4.1 g/dL (ref 3.5–5.0)
Alkaline Phosphatase: 50 U/L (ref 38–126)
BILIRUBIN TOTAL: 0.4 mg/dL (ref 0.3–1.2)
BUN: 12 mg/dL (ref 6–20)
CO2: 23 mmol/L (ref 22–32)
Calcium: 8.7 mg/dL — ABNORMAL LOW (ref 8.9–10.3)
Chloride: 108 mmol/L (ref 101–111)
Creatinine, Ser: 0.61 mg/dL (ref 0.44–1.00)
GFR calc Af Amer: >60 mL/min (ref >60)
Glucose, Bld: 106 mg/dL — ABNORMAL HIGH (ref 65–99)
POTASSIUM: 3.8 mmol/L (ref 3.5–5.1)
Sodium: 137 mmol/L (ref 135–145)
TOTAL PROTEIN: 7.8 g/dL (ref 6.5–8.1)

## 2017-07-03 LAB — URINALYSIS, COMPLETE (UACMP) WITH MICROSCOPIC
BILIRUBIN URINE: NEGATIVE
Bacteria, UA: NONE SEEN
GLUCOSE, UA: NEGATIVE mg/dL
HGB URINE DIPSTICK: NEGATIVE
KETONES UR: NEGATIVE mg/dL
LEUKOCYTES UA: NEGATIVE
NITRITE: NEGATIVE
PH: 7 (ref 5.0–8.0)
PROTEIN: NEGATIVE mg/dL
Specific Gravity, Urine: 1.02 (ref 1.005–1.030)

## 2017-07-03 LAB — LIPASE, BLOOD: LIPASE: 21 U/L (ref 11–51)

## 2017-07-03 LAB — POCT PREGNANCY, URINE: Preg Test, Ur: NEGATIVE

## 2017-07-03 MED ORDER — ONDANSETRON HCL 4 MG/2ML IJ SOLN
INTRAMUSCULAR | Status: AC
  Administered 2017-07-03: 4 mg via INTRAVENOUS
  Filled 2017-07-03: qty 2

## 2017-07-03 MED ORDER — SODIUM CHLORIDE 0.9 % IV BOLUS
1000.0000 mL | Freq: Once | INTRAVENOUS | Status: AC
  Administered 2017-07-03: 1000 mL via INTRAVENOUS

## 2017-07-03 MED ORDER — ONDANSETRON HCL 4 MG/2ML IJ SOLN
4.0000 mg | Freq: Once | INTRAMUSCULAR | Status: AC
  Administered 2017-07-03: 4 mg via INTRAVENOUS

## 2017-07-03 MED ORDER — FAMOTIDINE 20 MG PO TABS: 20.0000 mg | ORAL_TABLET | Freq: Two times a day (BID) | ORAL | 0 refills | Status: DC

## 2017-07-03 MED ORDER — ONDANSETRON 4 MG PO TBDP
4.0000 mg | ORAL_TABLET | Freq: Once | ORAL | Status: AC
  Administered 2017-07-03: 4 mg via ORAL
  Filled 2017-07-03: qty 1

## 2017-07-03 MED ORDER — FAMOTIDINE IN NACL 20-0.9 MG/50ML-% IV SOLN
20.0000 mg | Freq: Once | INTRAVENOUS | Status: AC
Start: 2017-07-03 — End: 2017-07-03
  Administered 2017-07-03: 20 mg via INTRAVENOUS
  Filled 2017-07-03: qty 50

## 2017-07-03 MED ORDER — ONDANSETRON 4 MG PO TBDP: 4.0000 mg | ORAL_TABLET | Freq: Three times a day (TID) | ORAL | 0 refills | Status: DC | PRN

## 2017-07-03 NOTE — ED Triage Notes (Signed)
Pt c/o pain in lower left abd and vomiting since this am (vomited x10) - denies other pain - c/o dizziness

## 2017-07-03 NOTE — ED Notes (Signed)
Pt taken to POV in wheelchair she is able to bear weight and ambulate at POV. VSS. NAD. Discharge instructions, RX and follow up discussed.

## 2017-07-03 NOTE — ED Notes (Signed)
Pt up to bathroom at this time

## 2017-07-03 NOTE — ED Notes (Signed)
Pt able to keep Sprite down.

## 2017-07-03 NOTE — ED Provider Notes (Signed)
[facility] Emergency Department Provider Note  ____________________________________________  Time seen: Approximately 11:24 AM  I have reviewed the triage vital signs and the nursing notes.   HISTORY  Chief Complaint Abdominal Pain and Emesis    HPI Brittany Braun is a 22 y.o. female with no significant past medical history who reports left lower quadrant abdominal pain, vomiting and diarrhea that all started this morning at about 4:00 AM. Pain is constant, gradual onset, no aggravating or alleviating factors, moderate intensity, aching. About 10 episodes of vomiting.  She denies recent illness. Denies sick contacts. 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      History reviewed. No pertinent past medical history.   There are no active problems to display for this patient.    History reviewed. No pertinent surgical history.   Prior to Admission medications   Medication Sig Start Date End Date Taking? Authorizing Provider  dicyclomine (BENTYL) 20 MG tablet Take 1 tablet (20 mg total) by mouth 3 (three) times daily as needed for spasms. Patient not taking: Reported on 11/14/2016 04/16/16   Emily Filbert, MD  famotidine (PEPCID) 20 MG tablet Take 1 tablet (20 mg total) by mouth 2 (two) times daily. 07/03/17   Sharman Cheek, MD  fexofenadine-pseudoephedrine (ALLEGRA-D) 60-120 MG 12 hr tablet Take 1 tablet by mouth 2 (two) times daily. Patient not taking: Reported on 11/14/2016 03/20/16   Joni Reining, PA-C  HYDROcodone-acetaminophen (NORCO/VICODIN) 5-325 MG tablet Take 1 tablet by mouth every 4 (four) hours as needed for moderate pain. Patient not taking: Reported on 11/14/2016 10/03/15   Tommi Rumps, PA-C  ibuprofen (ADVIL,MOTRIN) 600 MG tablet Take 1 tablet (600 mg total) by mouth every 8 (eight) hours as needed. Patient not taking: Reported on 11/14/2016 03/20/16   Joni Reining, PA-C  magic mouthwash w/lidocaine SOLN Take 5  mLs by mouth 4 (four) times daily. Patient not taking: Reported on 11/14/2016 02/12/16   Joni Reining, PA-C  naproxen (NAPROSYN) 500 MG tablet Take 1 tablet (500 mg total) by mouth 2 (two) times daily with a meal. Patient not taking: Reported on 11/14/2016 07/03/16   Sharman Cheek, MD  naproxen (NAPROSYN) 500 MG tablet Take 1 tablet (500 mg total) by mouth 2 (two) times daily with a meal. Patient not taking: Reported on 07/03/2017 11/14/16   Sharman Cheek, MD  ondansetron (ZOFRAN ODT) 4 MG disintegrating tablet Take 1 tablet (4 mg total) by mouth every 8 (eight) hours as needed for nausea or vomiting. 07/03/17   Sharman Cheek, MD  predniSONE (DELTASONE) 20 MG tablet Take 2 tablets (40 mg total) by mouth daily. Patient not taking: Reported on 07/03/2017 11/14/16   Sharman Cheek, MD     Allergies Patient has no known allergies.   No family history on file.  Social History Social History   Tobacco Use  . Smoking status: Current Every Day Smoker    Packs/day: 3.00    Types: Cigarettes  . Smokeless tobacco: Never Used  Substance Use Topics  . Alcohol use: Yes  . Drug use: No    Review of Systems  Constitutional:   No fever or chills.  ENT:   No sore throat. No rhinorrhea. Cardiovascular:   No chest pain or syncope. Respiratory:   No dyspnea or cough. Gastrointestinal:   positive as above for abdominal pain, vomiting and diarrhea.  Musculoskeletal:   Negative for focal pain or swelling All other systems reviewed and are negative  except as documented above in ROS and HPI.  ____________________________________________   PHYSICAL EXAM:  VITAL SIGNS: ED Triage Vitals [07/03/17 0930]  Enc Vitals Group     BP (!) 120/49     Pulse Rate 74     Resp 18     Temp 98.2 F (36.8 C)     Temp Source Oral     SpO2 100 %     Weight 222 lb (100.7 kg)     Height 5\' 6"  (1.676 m)     Head Circumference      Peak Flow      Pain Score 9     Pain Loc      Pain Edu?      Excl.  in GC?     Vital signs reviewed, nursing assessments reviewed.   Constitutional:   Alert and oriented. Well appearing and in no distress. Eyes:   Conjunctivae are normal. EOMI. PERRL. ENT      Head:   Normocephalic and atraumatic.      Nose:   No congestion/rhinnorhea.       Mouth/Throat:   MMM, no pharyngeal erythema. No peritonsillar mass.       Neck:   No meningismus. Full ROM. Hematological/Lymphatic/Immunilogical:   No cervical lymphadenopathy. Cardiovascular:   RRR. Symmetric bilateral radial and DP pulses.  No murmurs.  Respiratory:   Normal respiratory effort without tachypnea/retractions. Breath sounds are clear and equal bilaterally. No wheezes/rales/rhonchi. Gastrointestinal:   Soft without focal tenderness. Notable for mild generalized discomfort. Non distended. There is no CVA tenderness.  No rebound, rigidity, or guarding. Musculoskeletal:   Normal range of motion in all extremities. No joint effusions.  No lower extremity tenderness.  No edema. Neurologic:   Normal speech and language.  Motor grossly intact. No acute focal neurologic deficits are appreciated.  Skin:    Skin is warm, dry and intact. No rash noted.  No petechiae, purpura, or bullae.  ____________________________________________    LABS (pertinent positives/negatives) (all labs ordered are listed, but only abnormal results are displayed) Labs Reviewed  COMPREHENSIVE METABOLIC PANEL - Abnormal; Notable for the following components:      Result Value   Glucose, Bld 106 (*)    Calcium 8.7 (*)    ALT 12 (*)    All other components within normal limits  URINALYSIS, COMPLETE (UACMP) WITH MICROSCOPIC - Abnormal; Notable for the following components:   Color, Urine YELLOW (*)    APPearance CLEAR (*)    All other components within normal limits  CBC WITH DIFFERENTIAL/PLATELET - Abnormal; Notable for the following components:   RDW 14.8 (*)    Neutro Abs 8.8 (*)    Lymphs Abs 0.5 (*)    All other  components within normal limits  LIPASE, BLOOD  POC URINE PREG, ED  POCT PREGNANCY, URINE   ____________________________________________   EKG    ____________________________________________    RADIOLOGY  No results found.  ____________________________________________   PROCEDURES Procedures  ____________________________________________   CLINICAL IMPRESSION / ASSESSMENT AND PLAN / ED COURSE  Pertinent labs & imaging results that were available during my care of the patient were reviewed by me and considered in my medical decision making (see chart for details).      Clinical Course as of Jul 03 1340  Thu Jul 03, 2017  1004 patient presents with generalized abdominal pain and vomiting and loose bowel movements that started at 4 AM today. Vital signs are normal. Exam is otherwise reassuring and nonfocal.  Likely acute viral gastroenteritis. Check labs, IV fluids for hydration, antiemetics.   [PS]  1144 Labs unremarkable. Will focus on sx control and PO trial..   [PS]  1339 feels better. Tolerating oral intake. Agreeable with discharge. Vital signs remain normal. Wants  a work note.   [PS]    Clinical Course User Index [PS] Sharman CheekStafford, Teague Goynes, MD     ----------------------------------------- 1:41 PM on 07/03/2017 -----------------------------------------  Review of electronic medical records shows that I have seen this patient one year ago for similar symptoms. I treated her with Pepcid and Zofran as I am today. I suspect that she has underlying GERD which is accentuating her symptoms today. ____________________________________________   FINAL CLINICAL IMPRESSION(S) / ED DIAGNOSES    Final diagnoses:  Nausea vomiting and diarrhea  Generalized abdominal pain     ED Discharge Orders        Ordered    famotidine (PEPCID) 20 MG tablet  2 times daily     07/03/17 1340    ondansetron (ZOFRAN ODT) 4 MG disintegrating tablet  Every 8 hours PRN     07/03/17  1340      Portions of this note were generated with dragon dictation software. Dictation errors may occur despite best attempts at proofreading.    Sharman CheekStafford, Mckenley Birenbaum, MD 07/03/17 [phone] [id number]

## 2017-07-03 NOTE — ED Notes (Signed)
Pt given Sprite at this time. Will monitor for N/V

## 2017-09-07 ENCOUNTER — Encounter: Payer: Self-pay | Admitting: Emergency Medicine

## 2017-09-07 ENCOUNTER — Emergency Department
Admission: EM | Admit: 2017-09-07 | Discharge: 2017-09-07 | Disposition: A | Discharge status: Home / Self Care | Payer: Self-pay | Attending: Emergency Medicine | Admitting: Emergency Medicine

## 2017-09-07 DIAGNOSIS — Z79899 Other long term (current) drug therapy: Secondary | ICD-10-CM | POA: Insufficient documentation

## 2017-09-07 DIAGNOSIS — F17210 Nicotine dependence, cigarettes, uncomplicated: Secondary | ICD-10-CM | POA: Insufficient documentation

## 2017-09-07 DIAGNOSIS — K047 Periapical abscess without sinus: Secondary | ICD-10-CM

## 2017-09-07 DIAGNOSIS — F419 Anxiety disorder, unspecified: Secondary | ICD-10-CM

## 2017-09-07 MED ORDER — LIDOCAINE VISCOUS HCL 2 % MT SOLN: 10.0000 mL | OROMUCOSAL | 0 refills | Status: DC | PRN

## 2017-09-07 MED ORDER — CLINDAMYCIN HCL 300 MG PO CAPS: 300.0000 mg | ORAL_CAPSULE | Freq: Three times a day (TID) | ORAL | 0 refills | Status: AC

## 2017-09-07 MED ORDER — DIPHENHYDRAMINE HCL 25 MG PO CAPS: 25.0000 mg | ORAL_CAPSULE | ORAL | 0 refills | Status: DC | PRN

## 2017-09-07 MED ORDER — DIPHENHYDRAMINE HCL 25 MG PO CAPS
25.0000 mg | ORAL_CAPSULE | Freq: Once | ORAL | Status: AC
  Administered 2017-09-07: 25 mg via ORAL
  Filled 2017-09-07: qty 1

## 2017-09-07 NOTE — ED Provider Notes (Signed)
[facility] Emergency Department Provider Note  ____________________________________________  Time seen: Approximately 12:59 PM  I have reviewed the triage vital signs and the nursing notes.   HISTORY  Chief Complaint Allergic Reaction    HPI Brittany Braun is a 22 y.o. female that presents emergency department for evaluation of  feeling jittery after taking a dose of penicillin and oxycodone this morning.  Patient went to the dentist this morning for a right bottom dental abscess.  They were unable to do a dental block and she was sent home with a prescription for penicillin and oxycodone.  She has taken amoxicillin in the past and felt jittery after medication as well.  Previously, she was able to continue amoxicillin prescription with Benadryl. Jittery feeling has improved as she has calmed herself down. She has trouble with anxiety and does not take any medication for her anxiety.  She was going to make an appointment tomorrow with her PCP to discuss anxiety. She has an appointment with oral surgeon tomorrow. She denies hives, shortness of breath, throat itching, nausea, vomiting, rash.   History reviewed. No pertinent past medical history.  There are no active problems to display for this patient.   History reviewed. No pertinent surgical history.  Prior to Admission medications   Medication Sig Start Date End Date Taking? Authorizing Provider  clindamycin (CLEOCIN) 300 MG capsule Take 1 capsule (300 mg total) by mouth 3 (three) times daily for 10 days. 09/07/17 09/17/17  Enid Derry, PA-C  dicyclomine (BENTYL) 20 MG tablet Take 1 tablet (20 mg total) by mouth 3 (three) times daily as needed for spasms. Patient not taking: Reported on 11/14/2016 04/16/16   Emily Filbert, MD  diphenhydrAMINE (BENADRYL) 25 mg capsule Take 1 capsule (25 mg total) by mouth every 4 (four) hours as needed. 09/07/17 09/07/18  Enid Derry, PA-C  famotidine (PEPCID) 20 MG  tablet Take 1 tablet (20 mg total) by mouth 2 (two) times daily. 07/03/17   Sharman Cheek, MD  fexofenadine-pseudoephedrine (ALLEGRA-D) 60-120 MG 12 hr tablet Take 1 tablet by mouth 2 (two) times daily. Patient not taking: Reported on 11/14/2016 03/20/16   Joni Reining, PA-C  HYDROcodone-acetaminophen (NORCO/VICODIN) 5-325 MG tablet Take 1 tablet by mouth every 4 (four) hours as needed for moderate pain. Patient not taking: Reported on 11/14/2016 10/03/15   Tommi Rumps, PA-C  ibuprofen (ADVIL,MOTRIN) 600 MG tablet Take 1 tablet (600 mg total) by mouth every 8 (eight) hours as needed. Patient not taking: Reported on 11/14/2016 03/20/16   Joni Reining, PA-C  lidocaine (XYLOCAINE) 2 % solution Use as directed 10 mLs in the mouth or throat as needed for mouth pain. 09/07/17   Enid Derry, PA-C  magic mouthwash w/lidocaine SOLN Take 5 mLs by mouth 4 (four) times daily. Patient not taking: Reported on 11/14/2016 02/12/16   Joni Reining, PA-C  naproxen (NAPROSYN) 500 MG tablet Take 1 tablet (500 mg total) by mouth 2 (two) times daily with a meal. Patient not taking: Reported on 11/14/2016 07/03/16   Sharman Cheek, MD  naproxen (NAPROSYN) 500 MG tablet Take 1 tablet (500 mg total) by mouth 2 (two) times daily with a meal. Patient not taking: Reported on 07/03/2017 11/14/16   Sharman Cheek, MD  ondansetron (ZOFRAN ODT) 4 MG disintegrating tablet Take 1 tablet (4 mg total) by mouth every 8 (eight) hours as needed for nausea or vomiting. 07/03/17   Sharman Cheek, MD  predniSONE (DELTASONE) 20 MG tablet Take 2  tablets (40 mg total) by mouth daily. Patient not taking: Reported on 07/03/2017 11/14/16   Sharman CheekStafford, Phillip, MD    Allergies Patient has no known allergies.  No family history on file.  Social History Social History   Tobacco Use  . Smoking status: Current Every Day Smoker    Packs/day: 0.50    Types: Cigarettes  . Smokeless tobacco: Never Used  Substance Use Topics  . Alcohol  use: Yes  . Drug use: No     Review of Systems  Constitutional: No fever/chills Respiratory: No SOB. Gastrointestinal: No abdominal pain.  No nausea, no vomiting.  Musculoskeletal: Negative for musculoskeletal pain. Skin: Negative for rash, abrasions, lacerations, ecchymosis. Neurological: Negative for headaches, numbness or tingling   ____________________________________________   PHYSICAL EXAM:  VITAL SIGNS: ED Triage Vitals  Enc Vitals Group     BP 09/07/17 1240 117/83     Pulse Rate 09/07/17 1240 64     Resp 09/07/17 1240 20     Temp 09/07/17 1240 98.3 F (36.8 C)     Temp Source 09/07/17 1240 Oral     SpO2 09/07/17 1240 98 %     Weight 09/07/17 1241 222 lb (100.7 kg)     Height 09/07/17 1241 5\' 6"  (1.676 m)     Head Circumference --      Peak Flow --      Pain Score 09/07/17 1240 10     Pain Loc --      Pain Edu? --      Excl. in GC? --      Constitutional: Alert and oriented. Well appearing and in no acute distress. Speech clear. Eyes: Conjunctivae are normal. PERRL. EOMI. Head: Atraumatic. ENT:      Ears:      Nose: No congestion/rhinnorhea.      Mouth/Throat: Mucous membranes are moist.  Dental decay and tenderness to palpation over left bottom back molar. Swelling to left cheek. Neck: No stridor.  Cardiovascular: Normal rate, regular rhythm.  Good peripheral circulation. Respiratory: Normal respiratory effort without tachypnea or retractions. Lungs CTAB. Good air entry to the bases with no decreased or absent breath sounds. Musculoskeletal: Full range of motion to all extremities. No gross deformities appreciated. Neurologic:  Normal speech and language. No gross focal neurologic deficits are appreciated.  Skin:  Skin is warm, dry and intact. No rash noted. Psychiatric: Mood and affect are normal. Speech and behavior are normal. Patient exhibits appropriate insight and judgement.   ____________________________________________   LABS (all labs  ordered are listed, but only abnormal results are displayed)  Labs Reviewed - No data to display ____________________________________________  EKG   ____________________________________________  RADIOLOGY   No results found.  ____________________________________________    PROCEDURES  Procedure(s) performed:    Procedures    Medications  diphenhydrAMINE (BENADRYL) capsule 25 mg (25 mg Oral Given 09/07/17 1319)     ____________________________________________   INITIAL IMPRESSION / ASSESSMENT AND PLAN / ED COURSE  Pertinent labs & imaging results that were available during my care of the patient were reviewed by me and considered in my medical decision making (see chart for details).  Review of the [place] CSRS was performed in accordance of the [facility] prior to dispensing any controlled drugs.   Patient's diagnosis is consistent with dental abscess and anxiety.  Vital signs and exam are reassuring.  Symptoms are more consistent with anxiety, rather than medication reaction but patient has had this feeling of jitteriness with amoxicillin in the past.  She has taken amoxicillin with benadryl in the past without complication. Patient will take another dose of amoxicillin with Benadryl.  If symptoms of jitteriness do not improve with addition of Benadryl, she will discontinue prescription and begin clindamycin.  No throat itching/tingling, SOB, hives, rash, nausea, vomiting. She has an appointment with oral surgeon tomorrow.  Patient will be discharged home with prescriptions for benadryl, viscous lidocaine, clindamycin Patient is to follow up with oral surgeon as directed. Patient is given ED precautions to return to the ED for any worsening or new symptoms.     ____________________________________________  FINAL CLINICAL IMPRESSION(S) / ED DIAGNOSES  Final diagnoses:  Anxiety  Dental abscess      NEW MEDICATIONS STARTED DURING THIS VISIT:  ED Discharge Orders         Ordered    diphenhydrAMINE (BENADRYL) 25 mg capsule  Every 4 hours PRN     09/07/17 1323    clindamycin (CLEOCIN) 300 MG capsule  3 times daily     09/07/17 1323    lidocaine (XYLOCAINE) 2 % solution  As needed     09/07/17 1354          This chart was dictated using voice recognition software/Dragon. Despite best efforts to proofread, errors can occur which can change the meaning. Any change was purely unintentional.    Enid Derry, PA-C 09/07/17 1743    Sharman Cheek, MD 09/07/17 2203

## 2017-09-07 NOTE — ED Notes (Signed)
First Nurse Note: Pt to ED via [facility] from home for possible allergic reaction to penicillin. Per EMS no rash noted, lung sounds clear. Pt states that her legs are shaking and that she thinks this is a reaction to the penicillin. Pt is in NAD at this time.

## 2017-09-07 NOTE — Discharge Instructions (Addendum)
If jittery feeling continues with both Benadryl and amoxicillin, discontinue amoxicillin and begin clindamycin.  Continue percocet as needed for pain. Follow-up with oral surgeon tomorrow.

## 2017-09-07 NOTE — ED Notes (Signed)
Pt was seen at dentist today and the lidocaine would not numb her so dentist gave rx for pcn and percocet. Once filled she took one of each and states now jittery. Pt also has anxiety and is tearful. No redness, no hives, no respiratory issues, voice quality clear (had to tell her friend to not answer for her and that I need pt to answer so I can actually assess her).

## 2017-09-07 NOTE — ED Triage Notes (Addendum)
Pt in via [facility] from home; pt reports taking a dose of penicillin and percocet approximately one hour ago, reports being "jittery" since then.  Pt was seen at dentist this morning due to dental abscess.  Pt able to speak in clear, full sentences, no signs of respiratory distress and no rash noted at this time.  Pt tearful in triage; appears restless and anxious, vitals WDL, NAD noted at this time.

## 2018-03-25 ENCOUNTER — Encounter: Payer: Self-pay | Admitting: Emergency Medicine

## 2018-03-25 ENCOUNTER — Emergency Department: Payer: Self-pay

## 2018-03-25 ENCOUNTER — Other Ambulatory Visit: Payer: Self-pay

## 2018-03-25 ENCOUNTER — Emergency Department
Admission: EM | Admit: 2018-03-25 | Discharge: 2018-03-25 | Disposition: A | Discharge status: Home / Self Care | Payer: Self-pay | Attending: Emergency Medicine | Admitting: Emergency Medicine

## 2018-03-25 DIAGNOSIS — W109XXA Fall (on) (from) unspecified stairs and steps, initial encounter: Secondary | ICD-10-CM | POA: Insufficient documentation

## 2018-03-25 DIAGNOSIS — S300XXA Contusion of lower back and pelvis, initial encounter: Secondary | ICD-10-CM

## 2018-03-25 DIAGNOSIS — W19XXXA Unspecified fall, initial encounter: Secondary | ICD-10-CM

## 2018-03-25 DIAGNOSIS — Y9289 Other specified places as the place of occurrence of the external cause: Secondary | ICD-10-CM | POA: Insufficient documentation

## 2018-03-25 DIAGNOSIS — Y9389 Activity, other specified: Secondary | ICD-10-CM | POA: Insufficient documentation

## 2018-03-25 DIAGNOSIS — Y998 Other external cause status: Secondary | ICD-10-CM | POA: Insufficient documentation

## 2018-03-25 HISTORY — DX: Anxiety disorder, unspecified: F41.9

## 2018-03-25 LAB — POCT PREGNANCY, URINE: Preg Test, Ur: NEGATIVE

## 2018-03-25 MED ORDER — CYCLOBENZAPRINE HCL 10 MG PO TABS
10.0000 mg | ORAL_TABLET | Freq: Once | ORAL | Status: AC
  Administered 2018-03-25: 10 mg via ORAL
  Filled 2018-03-25: qty 1

## 2018-03-25 MED ORDER — KETOROLAC TROMETHAMINE 30 MG/ML IJ SOLN
30.0000 mg | Freq: Once | INTRAMUSCULAR | Status: AC
  Administered 2018-03-25: 30 mg via INTRAMUSCULAR
  Filled 2018-03-25: qty 1

## 2018-03-25 MED ORDER — BACLOFEN 10 MG PO TABS: 10.0000 mg | ORAL_TABLET | Freq: Three times a day (TID) | ORAL | 1 refills | Status: DC

## 2018-03-25 MED ORDER — MELOXICAM 15 MG PO TABS: 15.0000 mg | ORAL_TABLET | Freq: Every day | ORAL | 2 refills | Status: DC

## 2018-03-25 NOTE — ED Triage Notes (Signed)
Pt in via POV, reports mechanical fall this morning, sleeping on steps, landing on back.  Pt denies hitting head, denies LOC.  Reports she went to work since, but back pain has worsened.  Pt to triage in wheel chair, vitals WDL. NAD noted at this time.

## 2018-03-25 NOTE — ED Triage Notes (Signed)
FIRST NURSE NOTE-pt fell and hurt back. In wheel chair.

## 2018-03-25 NOTE — ED Provider Notes (Signed)
[facility] Emergency Department Provider Note  ____________________________________________   First MD Initiated Contact with Patient 03/25/18 1756     (approximate)  I have reviewed the triage vital signs and the nursing notes.   HISTORY  Chief Complaint Fall and Back Pain    HPI Brittany Braun is a 23 y.o. female presents emergency department after a fall.  She states that she was going to work and slipped on wooden steps and sliding down 4 of those steps.  She C/o low back pain,  pain is worse with movement, increased with bending over, denies numbness, tingling, or changes in bowel/urinary habits,  Using otc meds without relief, she denies head injury or LOC.  She denies abdominal pain. Remainder ros neg   Past Medical History:  Diagnosis Date  . Anxiety     There are no active problems to display for this patient.   History reviewed. No pertinent surgical history.  Prior to Admission medications   Medication Sig Start Date End Date Taking? Authorizing Provider  baclofen (LIORESAL) 10 MG tablet Take 1 tablet (10 mg total) by mouth 3 (three) times daily. 03/25/18 03/25/19  Fisher, Roselyn BeringSusan W, PA-C  meloxicam (MOBIC) 15 MG tablet Take 1 tablet (15 mg total) by mouth daily. 03/25/18 03/25/19  Faythe GheeFisher, Susan W, PA-C    Allergies Patient has no known allergies.  No family history on file.  Social History Social History   Tobacco Use  . Smoking status: Current Every Day Smoker    Packs/day: 0.25    Types: Cigarettes  . Smokeless tobacco: Never Used  Substance Use Topics  . Alcohol use: Yes  . Drug use: No    Review of Systems  Constitutional: No fever/chills Eyes: No visual changes. ENT: No sore throat. Respiratory: Denies cough Genitourinary: Negative for dysuria. Musculoskeletal: Positive for back pain. Skin: Negative for rash.    ____________________________________________   PHYSICAL EXAM:  VITAL SIGNS: ED Triage Vitals    Enc Vitals Group     BP 03/25/18 1634 115/72     Pulse Rate 03/25/18 1634 63     Resp 03/25/18 1634 16     Temp 03/25/18 1634 98.4 F (36.9 C)     Temp Source 03/25/18 1634 Oral     SpO2 03/25/18 1634 99 %     Weight 03/25/18 1635 230 lb (104.3 kg)     Height 03/25/18 1635 5\' 6"  (1.676 m)     Head Circumference --      Peak Flow --      Pain Score 03/25/18 1635 10     Pain Loc --      Pain Edu? --      Excl. in GC? --     Constitutional: Alert and oriented. Well appearing and in no acute distress. Eyes: Conjunctivae are normal.  Head: Atraumatic. Nose: No congestion/rhinnorhea. Mouth/Throat: Mucous membranes are moist.   Neck:  supple no lymphadenopathy noted Cardiovascular: Normal rate, regular rhythm. Respiratory: Normal respiratory effort.  No retractions,  GU: deferred Musculoskeletal: FROM all extremities, warm and well perfused.  Decreased rom of back due to discomfort, lumbar spine tender, C-spine is mildly tender, unable to test Slr due to patient's discomfort, full strength in great toes b/l, full strength in lower legs, n/v intact Neurologic:  Normal speech and language.  Skin:  Skin is warm, dry and intact. No rash noted. Psychiatric: Mood and affect are normal. Speech and behavior are normal.  ____________________________________________   LABS (all  labs ordered are listed, but only abnormal results are displayed)  Labs Reviewed  POCT PREGNANCY, URINE  POC URINE PREG, ED   ____________________________________________   ____________________________________________  RADIOLOGY  X-ray of the C-spine and lumbar spine are negative  ____________________________________________   PROCEDURES  Procedure(s) performed: Toradol 30 mg IM, Flexeril 10 mg p.o.   Procedures    ____________________________________________   INITIAL IMPRESSION / ASSESSMENT AND PLAN / ED COURSE  Pertinent labs & imaging results that were available during my care of the  patient were reviewed by me and considered in my medical decision making (see chart for details).   Patient is a 23 year old female presents emergency department complaining of a back injury.  States she fell down steps.  Complaining of low back pain and neck pain.  Physical exam shows a tender lumbar spine and mildly tender C-spine.  Thoracic spine appears to be nontender.  X-ray of the lumbar spine and C-spine were ordered Toradol 30 mg IM and Flexeril 10 mg p.o. were ordered    ----------------------------------------- 7:17 PM on 03/25/2018 -----------------------------------------  X-ray of the lumbar spine and C-spine are both negative.  Explained the test results to the patient.  She states she is much more comfortable since the Toradol and Flexeril.  She was given a prescription for meloxicam and baclofen.  She is apply ice to all areas that hurt.  She was given a work note for the next 2 days.  She is to follow-up with orthopedics or her regular doctor if not better in 5 to 7 days.  Return emergency department worsening.  She states she understands and will comply.  She was discharged in stable condition.  As part of my medical decision making, I reviewed the following data within the electronic MEDICAL RECORD NUMBER Nursing notes reviewed and incorporated, Old chart reviewed, Radiograph reviewed x-ray of the lumbar spine and C-spine are negative for fracture, Notes from prior ED visits and [place] Controlled Substance Database  ____________________________________________   FINAL CLINICAL IMPRESSION(S) / ED DIAGNOSES  Final diagnoses:  Fall, initial encounter  Lumbar contusion, initial encounter      NEW MEDICATIONS STARTED DURING THIS VISIT:  New Prescriptions   BACLOFEN (LIORESAL) 10 MG TABLET    Take 1 tablet (10 mg total) by mouth 3 (three) times daily.   MELOXICAM (MOBIC) 15 MG TABLET    Take 1 tablet (15 mg total) by mouth daily.     Note:  This document was prepared using  Dragon voice recognition software and may include unintentional dictation errors.     Faythe Ghee, PA-C 03/25/18 1918    Nita Sickle, MD 03/25/18 1929

## 2018-03-25 NOTE — Discharge Instructions (Signed)
Follow-up with your regular doctor if not better in 5 to 7 days or you may follow-up with orthopedics.  Dr. Ernest Pine is on-call she would have to call his office and make an appointment.  Apply ice to all areas that hurt.  In 3 days she may switch to wet heat followed by ice.  Take the medications as prescribed.  If the muscle relaxer makes you drowsy then you should only use it at home.  This is the medication that you take 3 times a day.

## 2018-04-14 ENCOUNTER — Encounter: Payer: Self-pay | Admitting: Emergency Medicine

## 2018-04-14 ENCOUNTER — Emergency Department: Payer: Self-pay

## 2018-04-14 ENCOUNTER — Emergency Department
Admission: EM | Admit: 2018-04-14 | Discharge: 2018-04-14 | Disposition: A | Discharge status: Home / Self Care | Payer: Self-pay | Attending: Emergency Medicine | Admitting: Emergency Medicine

## 2018-04-14 ENCOUNTER — Other Ambulatory Visit: Payer: Self-pay

## 2018-04-14 DIAGNOSIS — F17210 Nicotine dependence, cigarettes, uncomplicated: Secondary | ICD-10-CM | POA: Insufficient documentation

## 2018-04-14 DIAGNOSIS — F419 Anxiety disorder, unspecified: Secondary | ICD-10-CM | POA: Insufficient documentation

## 2018-04-14 DIAGNOSIS — J101 Influenza due to other identified influenza virus with other respiratory manifestations: Secondary | ICD-10-CM | POA: Insufficient documentation

## 2018-04-14 LAB — INFLUENZA PANEL BY PCR (TYPE A & B)
INFLAPCR: NEGATIVE
INFLBPCR: POSITIVE — AB

## 2018-04-14 MED ORDER — OSELTAMIVIR PHOSPHATE 75 MG PO CAPS: 75.0000 mg | ORAL_CAPSULE | Freq: Two times a day (BID) | ORAL | 0 refills | Status: AC

## 2018-04-14 MED ORDER — GUAIFENESIN-CODEINE 100-10 MG/5ML PO SOLN: 5.0000 mL | Freq: Four times a day (QID) | ORAL | 0 refills | Status: DC | PRN

## 2018-04-14 MED ORDER — ACETAMINOPHEN 325 MG PO TABS
650.0000 mg | ORAL_TABLET | Freq: Once | ORAL | Status: AC | PRN
  Administered 2018-04-14: 650 mg via ORAL
  Filled 2018-04-14: qty 2

## 2018-04-14 NOTE — ED Notes (Signed)
See triage note  Presents with fever cough and body aches  States sx's started on Sunday  Cough is non prod at present  Febrile on arrival

## 2018-04-14 NOTE — ED Provider Notes (Signed)
[facility] Emergency Department Provider Note   ____________________________________________   None    (approximate)  I have reviewed the triage vital signs and the nursing notes.   HISTORY  Chief Complaint Cough    HPI Brittany Braun is a 23 y.o. female to the ED with complaint of sudden onset of fever, cough, and body aches  for less than 48 hours.  Patient states that she has had a productive cough that has been unrelieved by over-the-counter medication.  Patient is a smoker but does not have a history of bronchitis or pneumonia.  She denies any nausea, vomiting or diarrhea.  She did not get the flu vaccine this year.  Early she rates her pain as an 8 out of 10.   Past Medical History:  Diagnosis Date  . Anxiety     There are no active problems to display for this patient.   History reviewed. No pertinent surgical history.  Prior to Admission medications   Medication Sig Start Date End Date Taking? Authorizing Provider  guaiFENesin-codeine 100-10 MG/5ML syrup Take 5 mLs by mouth every 6 (six) hours as needed. 04/14/18   Tommi Rumps, PA-C  oseltamivir (TAMIFLU) 75 MG capsule Take 1 capsule (75 mg total) by mouth 2 (two) times daily for 5 days. 04/14/18 04/19/18  Tommi Rumps, PA-C    Allergies Patient has no known allergies.  No family history on file.  Social History Social History   Tobacco Use  . Smoking status: Current Every Day Smoker    Packs/day: 0.25    Types: Cigarettes  . Smokeless tobacco: Never Used  Substance Use Topics  . Alcohol use: Yes  . Drug use: No    Review of Systems Constitutional: Positive fever/chills Eyes: No visual changes. ENT: No sore throat.  Positive nasal congestion. Cardiovascular: Denies chest pain. Respiratory: Denies shortness of breath.  Positive productive cough. Gastrointestinal: No abdominal pain.  No nausea, no vomiting.  No diarrhea.  No constipation. Genitourinary: Negative  for dysuria. Musculoskeletal: Positive for muscle aches. Skin: Negative for rash. Neurological:  positive for headaches, negative for focal weakness or numbness. ___________________________________________   PHYSICAL EXAM:  VITAL SIGNS: ED Triage Vitals  Enc Vitals Group     BP 04/14/18 1519 122/82     Pulse Rate 04/14/18 1519 (!) 102     Resp 04/14/18 1519 20     Temp 04/14/18 1519 (!) 101.9 F (38.8 C)     Temp Source 04/14/18 1519 Oral     SpO2 04/14/18 1519 97 %     Weight 04/14/18 1520 230 lb (104.3 kg)     Height 04/14/18 1520 5\' 6"  (1.676 m)     Head Circumference --      Peak Flow --      Pain Score 04/14/18 1520 8     Pain Loc --      Pain Edu? --      Excl. in [place]? --    Constitutional: Alert and oriented. Well appearing and in no acute distress. Eyes: Conjunctivae are normal.  Head: Atraumatic. Nose: Positive congestion/rhinnorhea. Mouth/Throat: Mucous membranes are moist.  Oropharynx non-erythematous. Neck: No stridor.   Hematological/Lymphatic/Immunilogical: No cervical lymphadenopathy. Cardiovascular: Normal rate, regular rhythm. Grossly normal heart sounds.  Good peripheral circulation. Respiratory: Normal respiratory effort.  No retractions. Lungs CTAB. Gastrointestinal: Soft and nontender. No distention. No abdominal bruits. No CVA tenderness. Musculoskeletal: No lower extremity tenderness nor edema.  No joint effusions. Neurologic:  Normal speech  and language. No gross focal neurologic deficits are appreciated.  Skin:  Skin is warm, dry and intact. No rash noted. Psychiatric: Mood and affect are normal. Speech and behavior are normal.  ____________________________________________   LABS (all labs ordered are listed, but only abnormal results are displayed)  Labs Reviewed  INFLUENZA PANEL BY PCR (TYPE A & B) - Abnormal; Notable for the following components:      Result Value   Influenza B By PCR POSITIVE (*)    All other components within normal  limits     RADIOLOGY  Official radiology report(s): Dg Chest 2 View  Result Date: 04/14/2018 CLINICAL DATA:  Cough.  Fever.  Chills.  Shortness of breath. EXAM: CHEST - 2 VIEW COMPARISON:  11/14/2016. FINDINGS: Mediastinum and hilar structures normal. Heart size normal. Mild right base atelectasis/infiltrate can not be excluded. No pleural effusion or pneumothorax. IMPRESSION: Mild atelectasis/infiltrate can not be excluded. Follow-up exams to demonstrate clearing suggested. Electronically Signed   By: Maisie Fus  Register   On: 04/14/2018 16:11    ____________________________________________   PROCEDURES  Procedure(s) performed: None  Procedures  Critical Care performed: No  ____________________________________________   INITIAL IMPRESSION / ASSESSMENT AND PLAN / ED COURSE  As part of my medical decision making, I reviewed the following data within the electronic MEDICAL RECORD NUMBER Notes from prior ED visits and [place] Controlled Substance Database  Patient presents to the ED with complaint of fever, cough, congestion and body aches that presented suddenly less than 48 hours ago.  Patient states that she has not had the flu vaccine.  Currently there is no other family members that are sick.  Physical exam is consistent with influenza with a temp of 101.9.  Influenza test was done in triage and was positive for influenza B.  Patient was made aware.  She was given a prescription for Tamiflu and Robitussin-AC.  ____________________________________________   FINAL CLINICAL IMPRESSION(S) / ED DIAGNOSES  Final diagnoses:  Influenza B     ED Discharge Orders         Ordered    oseltamivir (TAMIFLU) 75 MG capsule  2 times daily     04/14/18 1702    guaiFENesin-codeine 100-10 MG/5ML syrup  Every 6 hours PRN     04/14/18 1702           Note:  This document was prepared using Dragon voice recognition software and may include unintentional dictation errors.    Tommi Rumps,  PA-C 04/14/18 1708    Emily Filbert, MD 04/15/18 [phone]

## 2018-04-14 NOTE — Discharge Instructions (Addendum)
Follow-up with your primary care provider or can no clinic acute care if any continued problems.  Increase fluids.  Tylenol or ibuprofen as needed for fever and body aches.  Begin taking Tamiflu twice daily for the next 5 days.  Also Robitussin-AC has codeine in it.  This cough medication should not be taken while you are driving or operating machinery as it could cause drowsiness and increase your risk for injury.

## 2018-04-14 NOTE — ED Triage Notes (Signed)
Pt arrives with concerns over productive cough that started Sunday. Pt also reports a runny nose. Pt states she has generalized body aches

## 2018-06-08 ENCOUNTER — Encounter: Payer: Self-pay | Admitting: Emergency Medicine

## 2018-06-08 ENCOUNTER — Emergency Department
Admission: EM | Admit: 2018-06-08 | Discharge: 2018-06-08 | Disposition: A | Discharge status: Home / Self Care | Payer: Self-pay | Attending: Emergency Medicine | Admitting: Emergency Medicine

## 2018-06-08 ENCOUNTER — Emergency Department: Admission: EM | Admit: 2018-06-08 | Discharge: 2018-06-08 | Discharge status: Absent without leave | Payer: Self-pay

## 2018-06-08 ENCOUNTER — Other Ambulatory Visit: Payer: Self-pay

## 2018-06-08 DIAGNOSIS — R112 Nausea with vomiting, unspecified: Secondary | ICD-10-CM | POA: Insufficient documentation

## 2018-06-08 DIAGNOSIS — Z79899 Other long term (current) drug therapy: Secondary | ICD-10-CM | POA: Insufficient documentation

## 2018-06-08 DIAGNOSIS — F17210 Nicotine dependence, cigarettes, uncomplicated: Secondary | ICD-10-CM | POA: Insufficient documentation

## 2018-06-08 LAB — CBC
HEMATOCRIT: 37.3 % (ref 36.0–46.0)
HEMOGLOBIN: 11.9 g/dL — AB (ref 12.0–15.0)
MCH: 26.9 pg (ref 26.0–34.0)
MCHC: 31.9 g/dL (ref 30.0–36.0)
MCV: 84.4 fL (ref 80.0–100.0)
NRBC: 0 % (ref 0.0–0.2)
Platelets: 312 10*3/uL (ref 150–400)
RBC: 4.42 MIL/uL (ref 3.87–5.11)
RDW: 14.1 % (ref 11.5–15.5)
WBC: 6.6 10*3/uL (ref 4.0–10.5)

## 2018-06-08 LAB — COMPREHENSIVE METABOLIC PANEL
ALT: 16 U/L (ref 0–44)
ANION GAP: 8 (ref 5–15)
AST: 24 U/L (ref 15–41)
Albumin: 4.1 g/dL (ref 3.5–5.0)
Alkaline Phosphatase: 55 U/L (ref 38–126)
BILIRUBIN TOTAL: 0.4 mg/dL (ref 0.3–1.2)
BUN: 11 mg/dL (ref 6–20)
CHLORIDE: 107 mmol/L (ref 98–111)
CO2: 27 mmol/L (ref 22–32)
Calcium: 9.1 mg/dL (ref 8.9–10.3)
Creatinine, Ser: 0.76 mg/dL (ref 0.44–1.00)
Glucose, Bld: 113 mg/dL — ABNORMAL HIGH (ref 70–99)
POTASSIUM: 3.9 mmol/L (ref 3.5–5.1)
Sodium: 142 mmol/L (ref 135–145)
TOTAL PROTEIN: 8.3 g/dL — AB (ref 6.5–8.1)

## 2018-06-08 LAB — LIPASE, BLOOD: LIPASE: 16 U/L (ref 11–51)

## 2018-06-08 MED ORDER — DICYCLOMINE HCL 10 MG PO CAPS: 10.0000 mg | ORAL_CAPSULE | Freq: Three times a day (TID) | ORAL | 0 refills | Status: AC | PRN

## 2018-06-08 MED ORDER — SODIUM CHLORIDE 0.9 % IV BOLUS
1000.0000 mL | Freq: Once | INTRAVENOUS | Status: AC
  Administered 2018-06-08: 1000 mL via INTRAVENOUS

## 2018-06-08 MED ORDER — SODIUM CHLORIDE 0.9% FLUSH: 3.0000 mL | Freq: Once | INTRAVENOUS | Status: DC

## 2018-06-08 MED ORDER — DICYCLOMINE HCL 10 MG PO CAPS
10.0000 mg | ORAL_CAPSULE | Freq: Once | ORAL | Status: AC
  Administered 2018-06-08: 10 mg via ORAL
  Filled 2018-06-08: qty 1

## 2018-06-08 MED ORDER — ONDANSETRON 8 MG PO TBDP: 8.0000 mg | ORAL_TABLET | Freq: Three times a day (TID) | ORAL | 0 refills | Status: DC | PRN

## 2018-06-08 MED ORDER — ONDANSETRON HCL 4 MG/2ML IJ SOLN
4.0000 mg | Freq: Once | INTRAMUSCULAR | Status: AC
  Administered 2018-06-08: 4 mg via INTRAVENOUS
  Filled 2018-06-08: qty 2

## 2018-06-08 NOTE — Discharge Instructions (Signed)
You can take the prescribed medications if needed for continued symptoms.  Return to the ER for new or worsening vomiting, fever, weakness or any other new or worsening symptoms that concern you.

## 2018-06-08 NOTE — ED Triage Notes (Signed)
Pt states vomiting since 2300 last night. Began having cough this am, states she is dehydrated and can't keep anything down, denies diarrhea. NAD.

## 2018-06-08 NOTE — ED Provider Notes (Signed)
[facility] Emergency Department Provider Note ____________________________________________   First MD Initiated Contact with Patient 06/08/18 1417     (approximate)  I have reviewed the triage vital signs and the nursing notes.   HISTORY  Chief Complaint Emesis    HPI Brittany Braun is a 23 y.o. female who presents with vomiting since 11 PM last night, somewhat improving this afternoon, and associated with crampy periumbilical abdominal pain.  She reports one loose stool but denies diarrhea.  She has no fever.  The patient states that after vomiting she had cough and some chest tightness for a short time but this has resolved.  She did not eat anything unusual yesterday.  She denies sick contacts, travel, recent antibiotic use or hospitalization.  She has no exposure to anyone at elevated risk for COVID-19.  Past Medical History:  Diagnosis Date  . Anxiety     There are no active problems to display for this patient.   History reviewed. No pertinent surgical history.  Prior to Admission medications   Medication Sig Start Date End Date Taking? Authorizing Provider  dicyclomine (BENTYL) 10 MG capsule Take 1 capsule (10 mg total) by mouth 3 (three) times daily as needed for up to 5 days (abdominal cramping). 06/08/18 06/13/18  Dionne Bucy, MD  guaiFENesin-codeine 100-10 MG/5ML syrup Take 5 mLs by mouth every 6 (six) hours as needed. 04/14/18   Tommi Rumps, PA-C  ondansetron (ZOFRAN ODT) 8 MG disintegrating tablet Take 1 tablet (8 mg total) by mouth every 8 (eight) hours as needed. 06/08/18   Dionne Bucy, MD    Allergies Patient has no known allergies.  No family history on file.  Social History Social History   Tobacco Use  . Smoking status: Current Every Day Smoker    Packs/day: 0.25    Types: Cigarettes  . Smokeless tobacco: Never Used  Substance Use Topics  . Alcohol use: Yes  . Drug use: No    Review of Systems   Constitutional: No fever. Eyes: No redness. ENT: No sore throat. Cardiovascular: Denies chest pain. Respiratory: Denies shortness of breath.  Positive for resolved cough.   Gastrointestinal: Positive for vomiting.  No diarrhea.  Genitourinary: Negative for flank pain.  Musculoskeletal: Negative for back pain. Skin: Negative for rash. Neurological: Negative for headache.   ____________________________________________   PHYSICAL EXAM:  VITAL SIGNS: ED Triage Vitals [06/08/18 1243]  Enc Vitals Group     BP 113/80     Pulse Rate 63     Resp 16     Temp 98 F (36.7 C)     Temp Source Oral     SpO2 100 %     Weight 222 lb (100.7 kg)     Height 5\' 6"  (1.676 m)     Head Circumference      Peak Flow      Pain Score 10     Pain Loc      Pain Edu?      Excl. in GC?     Constitutional: Alert and oriented.  Relatively well appearing and in no acute distress. Eyes: Conjunctivae are normal.  No scleral icterus. Head: Atraumatic. Nose: No congestion/rhinnorhea. Mouth/Throat: Mucous membranes are slightly dry.   Neck: Normal range of motion.  Cardiovascular: Normal rate, regular rhythm. Good peripheral circulation. Respiratory: Normal respiratory effort.  No retractions.  Gastrointestinal: Soft and nontender. No distention.  Genitourinary: No flank tenderness. Musculoskeletal: Extremities warm and well perfused.  Neurologic:  Normal  speech and language. No gross focal neurologic deficits are appreciated.  Skin:  Skin is warm and dry. No rash noted. Psychiatric: Mood and affect are normal. Speech and behavior are normal.  ____________________________________________   LABS (all labs ordered are listed, but only abnormal results are displayed)  Labs Reviewed  COMPREHENSIVE METABOLIC PANEL - Abnormal; Notable for the following components:      Result Value   Glucose, Bld 113 (*)    Total Protein 8.3 (*)    All other components within normal limits  CBC - Abnormal; Notable  for the following components:   Hemoglobin 11.9 (*)    All other components within normal limits  LIPASE, BLOOD  URINALYSIS, COMPLETE (UACMP) WITH MICROSCOPIC  POC URINE PREG, ED   ____________________________________________  EKG   ____________________________________________  RADIOLOGY    ____________________________________________   PROCEDURES  Procedure(s) performed: No  Procedures  Critical Care performed: No ____________________________________________   INITIAL IMPRESSION / ASSESSMENT AND PLAN / ED COURSE  Pertinent labs & imaging results that were available during my care of the patient were reviewed by me and considered in my medical decision making (see chart for details).  23 year old female with no significant medical history presents with nausea and vomiting since last night with some crampy abdominal pain and one loose stool.  She has no fever.  The patient reports some brief cough and chest tightness after an episode of vomiting but it has now resolved.  On exam she is well-appearing.  Her vital signs are normal.  The abdomen is soft and nontender.  The remainder the exam is unremarkable.  Overall presentation is consistent with viral gastroenteritis versus foodborne illness.  We will give a fluid bolus and Zofran for symptomatic treatment.  Anticipate discharge home.  ----------------------------------------- 5:07 PM on 06/08/2018 -----------------------------------------  Patient is feeling better after the fluids and medications.  She would like to go home.  She is tolerating p.o. at this time.  Return precautions given, and she expresses understanding.  ____________________________________________   FINAL CLINICAL IMPRESSION(S) / ED DIAGNOSES  Final diagnoses:  Non-intractable vomiting with nausea, unspecified vomiting type      NEW MEDICATIONS STARTED DURING THIS VISIT:  New Prescriptions   DICYCLOMINE (BENTYL) 10 MG CAPSULE    Take 1  capsule (10 mg total) by mouth 3 (three) times daily as needed for up to 5 days (abdominal cramping).   ONDANSETRON (ZOFRAN ODT) 8 MG DISINTEGRATING TABLET    Take 1 tablet (8 mg total) by mouth every 8 (eight) hours as needed.     Note:  This document was prepared using Dragon voice recognition software and may include unintentional dictation errors.    Dionne Bucy, MD 06/08/18 [phone]

## 2018-06-08 NOTE — ED Notes (Signed)
EDP at bedside  

## 2019-03-11 ENCOUNTER — Other Ambulatory Visit: Payer: Self-pay

## 2019-03-11 ENCOUNTER — Emergency Department
Admission: EM | Admit: 2019-03-11 | Discharge: 2019-03-11 | Disposition: A | Discharge status: Home / Self Care | Payer: Self-pay | Attending: Emergency Medicine | Admitting: Emergency Medicine

## 2019-03-11 ENCOUNTER — Encounter: Payer: Self-pay | Admitting: Emergency Medicine

## 2019-03-11 DIAGNOSIS — Z79899 Other long term (current) drug therapy: Secondary | ICD-10-CM | POA: Insufficient documentation

## 2019-03-11 DIAGNOSIS — F17210 Nicotine dependence, cigarettes, uncomplicated: Secondary | ICD-10-CM | POA: Insufficient documentation

## 2019-03-11 DIAGNOSIS — K0889 Other specified disorders of teeth and supporting structures: Secondary | ICD-10-CM | POA: Insufficient documentation

## 2019-03-11 MED ORDER — AMOXICILLIN 500 MG PO CAPS: 500.0000 mg | ORAL_CAPSULE | Freq: Three times a day (TID) | ORAL | 0 refills | Status: DC

## 2019-03-11 MED ORDER — IBUPROFEN 600 MG PO TABS: 600.0000 mg | ORAL_TABLET | Freq: Three times a day (TID) | ORAL | 0 refills | Status: DC | PRN

## 2019-03-11 MED ORDER — TRAMADOL HCL 50 MG PO TABS: 50.0000 mg | ORAL_TABLET | Freq: Four times a day (QID) | ORAL | 0 refills | Status: AC | PRN

## 2019-03-11 NOTE — Discharge Instructions (Signed)
Follow discharge care instruction take medication as directed.  Follow-up with scheduled appointment [facility].

## 2019-03-11 NOTE — ED Provider Notes (Signed)
[facility] Emergency Department Provider Note   ____________________________________________   First MD Initiated Contact with Patient 03/11/19 1147     (approximate)  I have reviewed the triage vital signs and the nursing notes.   HISTORY  Chief Complaint Dental Problem    HPI Brittany Braun is a 23 y.o. female patient complains of dental pain probable molar area for 4 days.  Patient has history of devitalized teeth.  Patient states she is scheduled follow-up [place] [facility] in 5 days.  Patient rates the pain as a 9/10.  Patient described the pain is "achy".  No palliative measures for complaint.         Past Medical History:  Diagnosis Date  . Anxiety     There are no problems to display for this patient.   History reviewed. No pertinent surgical history.  Prior to Admission medications   Medication Sig Start Date End Date Taking? Authorizing Provider  amoxicillin (AMOXIL) 500 MG capsule Take 1 capsule (500 mg total) by mouth 3 (three) times daily. 03/11/19   Sable Feil, PA-C  dicyclomine (BENTYL) 10 MG capsule Take 1 capsule (10 mg total) by mouth 3 (three) times daily as needed for up to 5 days (abdominal cramping). 06/08/18 06/13/18  Arta Silence, MD  ibuprofen (ADVIL) 600 MG tablet Take 1 tablet (600 mg total) by mouth every 8 (eight) hours as needed. 03/11/19   Sable Feil, PA-C  traMADol (ULTRAM) 50 MG tablet Take 1 tablet (50 mg total) by mouth every 6 (six) hours as needed. 03/11/19 03/10/20  Sable Feil, PA-C    Allergies Patient has no known allergies.  History reviewed. No pertinent family history.  Social History Social History   Tobacco Use  . Smoking status: Current Every Day Smoker    Packs/day: 0.25    Types: Cigarettes  . Smokeless tobacco: Never Used  Substance Use Topics  . Alcohol use: Yes  . Drug use: No    Review of Systems Constitutional: No fever/chills Eyes: No visual  changes. ENT: No sore throat. Cardiovascular: Denies chest pain. Respiratory: Denies shortness of breath. Gastrointestinal: No abdominal pain.  No nausea, no vomiting.  No diarrhea.  No constipation. Genitourinary: Negative for dysuria. Musculoskeletal: Negative for back pain. Skin: Negative for rash. Neurological: Negative for headaches, focal weakness or numbness. Psychiatric:  Anxiety.  ____________________________________________   PHYSICAL EXAM:  VITAL SIGNS: ED Triage Vitals  Enc Vitals Group     BP 03/11/19 1218 129/84     Pulse Rate 03/11/19 1218 75     Resp 03/11/19 1218 18     Temp 03/11/19 1218 98.9 F (37.2 C)     Temp Source 03/11/19 1218 Oral     SpO2 03/11/19 1218 99 %     Weight 03/11/19 1209 222 lb (100.7 kg)     Height 03/11/19 1209 5\' 6"  (1.676 m)     Head Circumference --      Peak Flow --      Pain Score 03/11/19 1209 9     Pain Loc --      Pain Edu? --      Excl. in [place]? --    Constitutional: Alert and oriented. Well appearing and in no acute distress. Neck: No stridor.  No cervical spine tenderness to palpation. Hematological/Lymphatic/Immunilogical: No cervical lymphadenopathy. Cardiovascular: Normal rate, regular rhythm. Grossly normal heart sounds.  Good peripheral circulation. Respiratory: Normal respiratory effort.  No retractions. Lungs CTAB. Musculoskeletal: No  lower extremity tenderness nor edema.  No joint effusions. Neurologic:  Normal speech and language. No gross focal neurologic deficits are appreciated. No gait instability. Skin:  Skin is warm, dry and intact. No rash noted. Psychiatric: Mood and affect are normal. Speech and behavior are normal.  ____________________________________________   LABS (all labs ordered are listed, but only abnormal results are displayed)  Labs Reviewed - No data to display ____________________________________________  EKG   ____________________________________________  RADIOLOGY  ED MD  interpretation:    Official radiology report(s): No results found.  ____________________________________________   PROCEDURES  Procedure(s) performed (including Critical Care):  Procedures   ____________________________________________   INITIAL IMPRESSION / ASSESSMENT AND PLAN / ED COURSE  As part of my medical decision making, I reviewed the following data within the electronic MEDICAL RECORD NUMBER     Patient presents 4 days of dental pain.  Patient is scheduled to see dentist in 4 days.  Patient physical exam shows devitalized right upper molars.  Patient given discharge care instruction advised take medication as directed.    Farin R Abdelaziz was evaluated in Emergency Department on 03/11/2019 for the symptoms described in the history of present illness. She was evaluated in the context of the global COVID-19 pandemic, which necessitated consideration that the patient might be at risk for infection with the SARS-CoV-2 virus that causes COVID-19. Institutional protocols and algorithms that pertain to the evaluation of patients at risk for COVID-19 are in a state of rapid change based on information released by regulatory bodies including the CDC and federal and state organizations. These policies and algorithms were followed during the patient's care in the ED.       ____________________________________________   FINAL CLINICAL IMPRESSION(S) / ED DIAGNOSES  Final diagnoses:  Pain, dental     ED Discharge Orders         Ordered    amoxicillin (AMOXIL) 500 MG capsule  3 times daily     03/11/19 1259    traMADol (ULTRAM) 50 MG tablet  Every 6 hours PRN     03/11/19 1259    ibuprofen (ADVIL) 600 MG tablet  Every 8 hours PRN     03/11/19 1259           Note:  This document was prepared using Dragon voice recognition software and may include unintentional dictation errors.    Joni Reining, PA-C 03/11/19 1304    Shaune Pollack, MD 03/11/19 2046

## 2019-03-11 NOTE — ED Triage Notes (Signed)
Here for right upper dental pain.  NAD. Ambulatory. Unlabored.

## 2019-03-11 NOTE — ED Notes (Signed)
See triage note  Presents with possible dental abscess

## 2019-09-03 ENCOUNTER — Encounter: Payer: Self-pay | Admitting: Emergency Medicine

## 2019-09-03 ENCOUNTER — Other Ambulatory Visit: Payer: Self-pay

## 2019-09-03 ENCOUNTER — Emergency Department
Admission: EM | Admit: 2019-09-03 | Discharge: 2019-09-03 | Disposition: A | Discharge status: Home / Self Care | Payer: Self-pay | Attending: Emergency Medicine | Admitting: Emergency Medicine

## 2019-09-03 DIAGNOSIS — R112 Nausea with vomiting, unspecified: Secondary | ICD-10-CM | POA: Insufficient documentation

## 2019-09-03 DIAGNOSIS — Z791 Long term (current) use of non-steroidal anti-inflammatories (NSAID): Secondary | ICD-10-CM | POA: Insufficient documentation

## 2019-09-03 DIAGNOSIS — F17210 Nicotine dependence, cigarettes, uncomplicated: Secondary | ICD-10-CM | POA: Insufficient documentation

## 2019-09-03 DIAGNOSIS — R197 Diarrhea, unspecified: Secondary | ICD-10-CM | POA: Insufficient documentation

## 2019-09-03 DIAGNOSIS — R109 Unspecified abdominal pain: Secondary | ICD-10-CM | POA: Insufficient documentation

## 2019-09-03 DIAGNOSIS — Z792 Long term (current) use of antibiotics: Secondary | ICD-10-CM | POA: Insufficient documentation

## 2019-09-03 DIAGNOSIS — F1099 Alcohol use, unspecified with unspecified alcohol-induced disorder: Secondary | ICD-10-CM | POA: Insufficient documentation

## 2019-09-03 LAB — COMPREHENSIVE METABOLIC PANEL
ALT: 12 U/L (ref 0–44)
AST: 18 U/L (ref 15–41)
Albumin: 4.3 g/dL (ref 3.5–5.0)
Alkaline Phosphatase: 51 U/L (ref 38–126)
Anion gap: 11 (ref 5–15)
BUN: 10 mg/dL (ref 6–20)
CO2: 25 mmol/L (ref 22–32)
Calcium: 9 mg/dL (ref 8.9–10.3)
Chloride: 103 mmol/L (ref 98–111)
Creatinine, Ser: 0.76 mg/dL (ref 0.44–1.00)
GFR calc Af Amer: >60 mL/min (ref >60)
GFR calc non Af Amer: >60 mL/min (ref >60)
Glucose, Bld: 82 mg/dL (ref 70–99)
Potassium: 3.8 mmol/L (ref 3.5–5.1)
Sodium: 139 mmol/L (ref 135–145)
Total Bilirubin: 0.6 mg/dL (ref 0.3–1.2)
Total Protein: 8.1 g/dL (ref 6.5–8.1)

## 2019-09-03 LAB — CBC
HCT: 37.4 % (ref 36.0–46.0)
Hemoglobin: 12.5 g/dL (ref 12.0–15.0)
MCH: 28.2 pg (ref 26.0–34.0)
MCHC: 33.4 g/dL (ref 30.0–36.0)
MCV: 84.2 fL (ref 80.0–100.0)
Platelets: 310 10*3/uL (ref 150–400)
RBC: 4.44 MIL/uL (ref 3.87–5.11)
RDW: 13.7 % (ref 11.5–15.5)
WBC: 7.4 10*3/uL (ref 4.0–10.5)
nRBC: 0 % (ref 0.0–0.2)

## 2019-09-03 LAB — LIPASE, BLOOD: Lipase: 19 U/L (ref 11–51)

## 2019-09-03 MED ORDER — ONDANSETRON 4 MG PO TBDP
4.0000 mg | ORAL_TABLET | Freq: Once | ORAL | Status: AC
  Administered 2019-09-03: 4 mg via ORAL
  Filled 2019-09-03: qty 1

## 2019-09-03 MED ORDER — SUCRALFATE 1 G PO TABS: 1.0000 g | ORAL_TABLET | Freq: Four times a day (QID) | ORAL | 0 refills | Status: AC

## 2019-09-03 MED ORDER — ONDANSETRON HCL 4 MG PO TABS: 4.0000 mg | ORAL_TABLET | Freq: Three times a day (TID) | ORAL | 0 refills | Status: DC | PRN

## 2019-09-03 MED ORDER — SODIUM CHLORIDE 0.9% FLUSH: 3.0000 mL | Freq: Once | INTRAVENOUS | Status: DC

## 2019-09-03 NOTE — ED Notes (Addendum)
Pt in from home with c/o abdominal pain all across abdomen, n/v. Denies diarrhea. States last BM today which was baseline. Denies changes in urination. Refused urine sample stating "there is no way I could be pregnant because I'm gay". Pt tender to palpation. Pt given warm blanket. Bed locked low. Rail up.

## 2019-09-03 NOTE — ED Notes (Signed)
EDP Derrill Kay at bedside. Pt alert and sitting calmly in bed.

## 2019-09-03 NOTE — Discharge Instructions (Addendum)
Please seek medical attention for any high fevers, chest pain, shortness of breath, change in behavior, persistent vomiting, bloody stool or any other new or concerning symptoms.  

## 2019-09-03 NOTE — ED Notes (Signed)
Pt resting calmly in bed.  

## 2019-09-03 NOTE — ED Notes (Signed)
Pt signed printed d/c paperwork.  

## 2019-09-03 NOTE — ED Triage Notes (Signed)
Pt presents to ED via POV with c/o lower abdominal pain and emesis. Pt states sudden onset lower abdominal pain at approx 0900 this morning, pt states has been awake since 0400 this morning vomiting. Pt states too many to count episodes of vomiting since this morning. VSS in triage, pt ambulatory without difficulty A&O x4.

## 2019-09-03 NOTE — ED Provider Notes (Signed)
[facility] Emergency Department Provider Note   ____________________________________________   I have reviewed the triage vital signs and the nursing notes.   HISTORY  Chief Complaint Abdominal Pain and Emesis   History limited by: Not Limited   HPI Brittany Braun is a 24 y.o. female who presents to the emergency department today because of concern for nausea, vomiting and abdominal pain. Symptoms started this morning. States she was in her normal state of health yesterday. Denies any unusual ingestions yesterday. Says that she has had multiple episodes of non bloody emesis. Has not had any diarrhea. Has not noticed any blood in any recent stools. Started developing pain in her lower abdomen. Patient states she is not pregnant and is currently on her period and thus does not feel comfortable giving a urine sample because of the blood.    Records reviewed. Per medical record review patient has a history of ER visits in the past for nausea, vomiting.   Past Medical History:  Diagnosis Date  . Anxiety     There are no problems to display for this patient.   History reviewed. No pertinent surgical history.  Prior to Admission medications   Medication Sig Start Date End Date Taking? Authorizing Provider  amoxicillin (AMOXIL) 500 MG capsule Take 1 capsule (500 mg total) by mouth 3 (three) times daily. 03/11/19   Sable Feil, PA-C  dicyclomine (BENTYL) 10 MG capsule Take 1 capsule (10 mg total) by mouth 3 (three) times daily as needed for up to 5 days (abdominal cramping). 06/08/18 06/13/18  Arta Silence, MD  ibuprofen (ADVIL) 600 MG tablet Take 1 tablet (600 mg total) by mouth every 8 (eight) hours as needed. 03/11/19   Sable Feil, PA-C  traMADol (ULTRAM) 50 MG tablet Take 1 tablet (50 mg total) by mouth every 6 (six) hours as needed. 03/11/19 03/10/20  Sable Feil, PA-C    Allergies Patient has no known allergies.  History reviewed. No  pertinent family history.  Social History Social History   Tobacco Use  . Smoking status: Current Every Day Smoker    Packs/day: 0.25    Types: Cigarettes  . Smokeless tobacco: Never Used  Vaping Use  . Vaping Use: Never used  Substance Use Topics  . Alcohol use: Yes  . Drug use: No    Review of Systems Constitutional: No fever/chills Eyes: No visual changes. ENT: No sore throat. Cardiovascular: Denies chest pain. Respiratory: Denies shortness of breath. Gastrointestinal: Positive for abdominal pain, nausea, vomiting.  Genitourinary: Negative for dysuria. Musculoskeletal: Negative for back pain. Skin: Negative for rash. Neurological: Negative for headaches, focal weakness or numbness.  ____________________________________________   PHYSICAL EXAM:  VITAL SIGNS: ED Triage Vitals  Enc Vitals Group     BP 09/03/19 1310 117/79     Pulse Rate 09/03/19 1310 68     Resp 09/03/19 1310 20     Temp 09/03/19 1310 98.2 F (36.8 C)     Temp Source 09/03/19 1310 Oral     SpO2 09/03/19 1310 99 %     Weight 09/03/19 1310 222 lb (100.7 kg)     Height 09/03/19 1310 5\' 6"  (1.676 m)     Head Circumference --      Peak Flow --      Pain Score 09/03/19 1312 10   Constitutional: Alert and oriented.  Eyes: Conjunctivae are normal.  ENT      Head: Normocephalic and atraumatic.      Nose:  No congestion/rhinnorhea.      Mouth/Throat: Mucous membranes are moist.      Neck: No stridor. Hematological/Lymphatic/Immunilogical: No cervical lymphadenopathy. Cardiovascular: Normal rate, regular rhythm.  No murmurs, rubs, or gallops.  Respiratory: Normal respiratory effort without tachypnea nor retractions. Breath sounds are clear and equal bilaterally. No wheezes/rales/rhonchi. Gastrointestinal: Soft and non tender. No rebound. No guarding.  Genitourinary: Deferred Musculoskeletal: Normal range of motion in all extremities. No lower extremity edema. Neurologic:  Normal speech and language.  No gross focal neurologic deficits are appreciated.  Skin:  Skin is warm, dry and intact. No rash noted. Psychiatric: Mood and affect are normal. Speech and behavior are normal. Patient exhibits appropriate insight and judgment.  ____________________________________________    LABS (pertinent positives/negatives)  Lipase 19 CBC wbc 7.4, hgb 12.5, plt 310 CMP wnl  ____________________________________________   EKG  None  ____________________________________________    RADIOLOGY  None  ____________________________________________   PROCEDURES  Procedures  ____________________________________________   INITIAL IMPRESSION / ASSESSMENT AND PLAN / ED COURSE  Pertinent labs & imaging results that were available during my care of the patient were reviewed by me and considered in my medical decision making (see chart for details).   Patient presented to the emergency department today with concerns for nausea and vomiting and some abdominal discomfort.  Patient's blood work here without any concerning leukocytosis. She did feel better after medication and was able to tolerate PO. At this time doubt significant intraabdominal infection. Doubt obstruction. Will discharge with medication to help with her symptoms.  ____________________________________________   FINAL CLINICAL IMPRESSION(S) / ED DIAGNOSES  Final diagnoses:  Non-intractable vomiting with nausea, unspecified vomiting type     Note: This dictation was prepared with Dragon dictation. Any transcriptional errors that result from this process are unintentional     Phineas Semen, MD 09/03/19 1736

## 2019-09-28 ENCOUNTER — Telehealth: Payer: Self-pay | Admitting: General Practice

## 2019-09-28 NOTE — Telephone Encounter (Signed)
Individual has been contacted 3+ times regarding ED referral. No further attempts to contact individual will be made. 

## 2019-11-08 ENCOUNTER — Emergency Department
Admission: EM | Admit: 2019-11-08 | Discharge: 2019-11-08 | Disposition: A | Discharge status: Home / Self Care | Payer: HRSA Program | Attending: Emergency Medicine | Admitting: Emergency Medicine

## 2019-11-08 ENCOUNTER — Other Ambulatory Visit: Payer: Self-pay

## 2019-11-08 ENCOUNTER — Encounter: Payer: Self-pay | Admitting: Emergency Medicine

## 2019-11-08 DIAGNOSIS — U071 COVID-19: Secondary | ICD-10-CM | POA: Insufficient documentation

## 2019-11-08 DIAGNOSIS — Z79899 Other long term (current) drug therapy: Secondary | ICD-10-CM | POA: Diagnosis not present

## 2019-11-08 DIAGNOSIS — R519 Headache, unspecified: Secondary | ICD-10-CM | POA: Diagnosis present

## 2019-11-08 DIAGNOSIS — F17210 Nicotine dependence, cigarettes, uncomplicated: Secondary | ICD-10-CM | POA: Insufficient documentation

## 2019-11-08 LAB — COMPREHENSIVE METABOLIC PANEL
ALT: 13 U/L (ref 0–44)
AST: 19 U/L (ref 15–41)
Albumin: 4.2 g/dL (ref 3.5–5.0)
Alkaline Phosphatase: 49 U/L (ref 38–126)
Anion gap: 9 (ref 5–15)
BUN: 10 mg/dL (ref 6–20)
CO2: 24 mmol/L (ref 22–32)
Calcium: 8.8 mg/dL — ABNORMAL LOW (ref 8.9–10.3)
Chloride: 105 mmol/L (ref 98–111)
Creatinine, Ser: 0.7 mg/dL (ref 0.44–1.00)
GFR calc Af Amer: >60 mL/min (ref >60)
GFR calc non Af Amer: >60 mL/min (ref >60)
Glucose, Bld: 83 mg/dL (ref 70–99)
Potassium: 3.9 mmol/L (ref 3.5–5.1)
Sodium: 138 mmol/L (ref 135–145)
Total Bilirubin: 0.4 mg/dL (ref 0.3–1.2)
Total Protein: 8 g/dL (ref 6.5–8.1)

## 2019-11-08 LAB — URINALYSIS, COMPLETE (UACMP) WITH MICROSCOPIC
Bacteria, UA: NONE SEEN
Bilirubin Urine: NEGATIVE
Glucose, UA: NEGATIVE mg/dL
Ketones, ur: NEGATIVE mg/dL
Leukocytes,Ua: NEGATIVE
Nitrite: NEGATIVE
Protein, ur: NEGATIVE mg/dL
Specific Gravity, Urine: 1.017 (ref 1.005–1.030)
WBC, UA: NONE SEEN WBC/hpf (ref 0–5)
pH: 5 (ref 5.0–8.0)

## 2019-11-08 LAB — CBC
HCT: 39.8 % (ref 36.0–46.0)
Hemoglobin: 13 g/dL (ref 12.0–15.0)
MCH: 28.6 pg (ref 26.0–34.0)
MCHC: 32.7 g/dL (ref 30.0–36.0)
MCV: 87.5 fL (ref 80.0–100.0)
Platelets: 226 10*3/uL (ref 150–400)
RBC: 4.55 MIL/uL (ref 3.87–5.11)
RDW: 13.8 % (ref 11.5–15.5)
WBC: 2.9 10*3/uL — ABNORMAL LOW (ref 4.0–10.5)
nRBC: 0 % (ref 0.0–0.2)

## 2019-11-08 LAB — LIPASE, BLOOD: Lipase: 21 U/L (ref 11–51)

## 2019-11-08 LAB — POCT PREGNANCY, URINE: Preg Test, Ur: NEGATIVE

## 2019-11-08 LAB — SARS CORONAVIRUS 2 BY RT PCR (HOSPITAL ORDER, PERFORMED IN ~~LOC~~ HOSPITAL LAB): SARS Coronavirus 2: POSITIVE — AB

## 2019-11-08 MED ORDER — BENZONATATE 100 MG PO CAPS: 100.0000 mg | ORAL_CAPSULE | Freq: Three times a day (TID) | ORAL | 0 refills | Status: AC | PRN

## 2019-11-08 MED ORDER — KETOROLAC TROMETHAMINE 30 MG/ML IJ SOLN
30.0000 mg | Freq: Once | INTRAMUSCULAR | Status: AC
  Administered 2019-11-08: 30 mg via INTRAMUSCULAR
  Filled 2019-11-08: qty 1

## 2019-11-08 MED ORDER — DIPHENHYDRAMINE HCL 25 MG PO CAPS
25.0000 mg | ORAL_CAPSULE | Freq: Once | ORAL | Status: AC
  Administered 2019-11-08: 25 mg via ORAL
  Filled 2019-11-08: qty 1

## 2019-11-08 MED ORDER — BENZONATATE 100 MG PO CAPS: 100.0000 mg | ORAL_CAPSULE | Freq: Three times a day (TID) | ORAL | 0 refills | Status: DC | PRN

## 2019-11-08 MED ORDER — METOCLOPRAMIDE HCL 10 MG PO TABS
10.0000 mg | ORAL_TABLET | Freq: Once | ORAL | Status: AC
  Administered 2019-11-08: 10 mg via ORAL
  Filled 2019-11-08: qty 1

## 2019-11-08 MED ORDER — ONDANSETRON 4 MG PO TBDP: 4.0000 mg | ORAL_TABLET | Freq: Three times a day (TID) | ORAL | 0 refills | Status: AC | PRN

## 2019-11-08 MED ORDER — KETOROLAC TROMETHAMINE 10 MG PO TABS: 10.0000 mg | ORAL_TABLET | Freq: Four times a day (QID) | ORAL | 0 refills | Status: AC | PRN

## 2019-11-08 MED ORDER — ALBUTEROL SULFATE HFA 108 (90 BASE) MCG/ACT IN AERS: 2.0000 | INHALATION_SPRAY | Freq: Four times a day (QID) | RESPIRATORY_TRACT | 2 refills | Status: AC | PRN

## 2019-11-08 NOTE — Discharge Instructions (Signed)
You have been prescribed oral Toradol for headache. You have been prescribed Zofran if you experience nausea. You have been prescribed albuterol to be taken 2 puffs every 4 hours as needed for shortness of breath. You have been prescribed Tessalon Perles for cough.

## 2019-11-08 NOTE — ED Provider Notes (Signed)
Emergency Department Provider Note  ____________________________________________  Time seen: Approximately 5:50 PM  I have reviewed the triage vital signs and the nursing notes.   HISTORY  Chief Complaint Headache, Diarrhea, and Nasal Congestion   Historian Patient     HPI Brittany Braun is a 24 y.o. female presents to the emergency department with headache, diarrhea, body aches and nasal congestion for the past 2 to 3 days.  Patient has had low-grade fever at home.  No chest pain, chest tightness or abdominal pain.  No sick contacts with COVID-19 according to patient.  No recent travel.  No other alleviating measures have been attempted.   Past Medical History:  Diagnosis Date  . Anxiety      Immunizations up to date:  Yes.     Past Medical History:  Diagnosis Date  . Anxiety     There are no problems to display for this patient.   History reviewed. No pertinent surgical history.  Prior to Admission medications   Medication Sig Start Date End Date Taking? Authorizing Provider  albuterol (VENTOLIN HFA) 108 (90 Base) MCG/ACT inhaler Inhale 2 puffs into the lungs every 6 (six) hours as needed for wheezing or shortness of breath. 11/08/19   Orvil Feil, PA-C  amoxicillin (AMOXIL) 500 MG capsule Take 1 capsule (500 mg total) by mouth 3 (three) times daily. 03/11/19   Joni Reining, PA-C  benzonatate (TESSALON PERLES) 100 MG capsule Take 1 capsule (100 mg total) by mouth 3 (three) times daily as needed for up to 7 days for cough. 11/08/19 11/15/19  Orvil Feil, PA-C  dicyclomine (BENTYL) 10 MG capsule Take 1 capsule (10 mg total) by mouth 3 (three) times daily as needed for up to 5 days (abdominal cramping). 06/08/18 06/13/18  Dionne Bucy, MD  ibuprofen (ADVIL) 600 MG tablet Take 1 tablet (600 mg total) by mouth every 8 (eight) hours as needed. 03/11/19   Joni Reining, PA-C  ketorolac (TORADOL) 10 MG tablet Take 1 tablet (10 mg total) by mouth every 6  (six) hours as needed for up to 5 days. 11/08/19 11/13/19  Orvil Feil, PA-C  ondansetron (ZOFRAN ODT) 4 MG disintegrating tablet Take 1 tablet (4 mg total) by mouth every 8 (eight) hours as needed for up to 5 days. 11/08/19 11/13/19  Orvil Feil, PA-C  ondansetron (ZOFRAN) 4 MG tablet Take 1 tablet (4 mg total) by mouth every 8 (eight) hours as needed. 09/03/19   Phineas Semen, MD  sucralfate (CARAFATE) 1 g tablet Take 1 tablet (1 g total) by mouth 4 (four) times daily. 09/03/19   Phineas Semen, MD  traMADol (ULTRAM) 50 MG tablet Take 1 tablet (50 mg total) by mouth every 6 (six) hours as needed. 03/11/19 03/10/20  Joni Reining, PA-C    Allergies Patient has no known allergies.  No family history on file.  Social History Social History   Tobacco Use  . Smoking status: Current Every Day Smoker    Packs/day: 0.25    Types: Cigarettes  . Smokeless tobacco: Never Used  Vaping Use  . Vaping Use: Never used  Substance Use Topics  . Alcohol use: Yes  . Drug use: No     Review of Systems  Constitutional: No fever/chills Eyes:  No discharge ENT: No upper respiratory complaints. Respiratory: no cough. No SOB/ use of accessory muscles to breath Gastrointestinal: Patient has diarrhea.  Musculoskeletal: Patient has body aches.  Skin: Negative for rash, abrasions, lacerations, ecchymosis.  ____________________________________________   PHYSICAL EXAM:  VITAL SIGNS: ED Triage Vitals  Enc Vitals Group     BP 11/08/19 1214 105/77     Pulse Rate 11/08/19 1214 72     Resp 11/08/19 1214 14     Temp 11/08/19 1214 99.2 F (37.3 C)     Temp Source 11/08/19 1214 Oral     SpO2 11/08/19 1214 99 %     Weight 11/08/19 1215 222 lb (100.7 kg)     Height 11/08/19 1215 5\' 6"  (1.676 m)     Head Circumference --      Peak Flow --      Pain Score 11/08/19 1214 10     Pain Loc --      Pain Edu? --      Excl. in GC? --      Constitutional: Alert and oriented. Patient is lying  supine. Eyes: Conjunctivae are normal. PERRL. EOMI. Head: Atraumatic. ENT:      Ears: Tympanic membranes are mildly injected with mild effusion bilaterally.       Nose: No congestion/rhinnorhea.      Mouth/Throat: Mucous membranes are moist. Posterior pharynx is mildly erythematous.  Hematological/Lymphatic/Immunilogical: No cervical lymphadenopathy.  Cardiovascular: Normal rate, regular rhythm. Normal S1 and S2.  Good peripheral circulation. Respiratory: Normal respiratory effort without tachypnea or retractions. Lungs CTAB. Good air entry to the bases with no decreased or absent breath sounds. Gastrointestinal: Bowel sounds 4 quadrants. Soft and nontender to palpation. No guarding or rigidity. No palpable masses. No distention. No CVA tenderness. Musculoskeletal: Full range of motion to all extremities. No gross deformities appreciated. Neurologic:  Normal speech and language. No gross focal neurologic deficits are appreciated.  Skin:  Skin is warm, dry and intact. No rash noted. Psychiatric: Mood and affect are normal. Speech and behavior are normal. Patient exhibits appropriate insight and judgement.    ____________________________________________   LABS (all labs ordered are listed, but only abnormal results are displayed)  Labs Reviewed  SARS CORONAVIRUS 2 BY RT PCR (HOSPITAL ORDER, PERFORMED IN [facility]) - Abnormal; Notable for the following components:      Result Value   SARS Coronavirus 2 POSITIVE (*)    All other components within normal limits  COMPREHENSIVE METABOLIC PANEL - Abnormal; Notable for the following components:   Calcium 8.8 (*)    All other components within normal limits  CBC - Abnormal; Notable for the following components:   WBC 2.9 (*)    All other components within normal limits  URINALYSIS, COMPLETE (UACMP) WITH MICROSCOPIC - Abnormal; Notable for the following components:   Color, Urine YELLOW (*)    APPearance CLEAR (*)    Hgb  urine dipstick MODERATE (*)    All other components within normal limits  LIPASE, BLOOD  POC URINE PREG, ED  POCT PREGNANCY, URINE   ____________________________________________  EKG   ____________________________________________  RADIOLOGY   No results found.  ____________________________________________    PROCEDURES  Procedure(s) performed:     Procedures     Medications  ketorolac (TORADOL) 30 MG/ML injection 30 mg (30 mg Intramuscular Given 11/08/19 1630)  metoCLOPramide (REGLAN) tablet 10 mg (10 mg Oral Given 11/08/19 1629)  diphenhydrAMINE (BENADRYL) capsule 25 mg (25 mg Oral Given 11/08/19 1629)     ____________________________________________   INITIAL IMPRESSION / ASSESSMENT AND PLAN / ED COURSE  Pertinent labs & imaging results that were available during my care of the patient were reviewed by me and considered in my  medical decision making (see chart for details).      Assessment and plan:  Cough Body aches:  24 year old female presents to the emergency department with cough, body aches, headache and malaise for the past 2 to 3 days.  Vital signs were reassuring at triage.  On physical exam, patient had no increased work of breathing and no adventitious lung sounds were auscultated.  Patient tested positive for COVID-19.  Lymphopenia was noted on CBC but was otherwise reassuring.  Urinalysis was noncontributory for cystitis.  CMP was reassuring.  Urine pregnancy testing was negative.  Patient reported that her headache improved after Toradol, Benadryl and Reglan were given in the emergency department.  She was discharged with oral Toradol, albuterol, Zofran and Tessalon Perles for cough.  Return precautions were given to return with new or worsening symptoms.   ____________________________________________  FINAL CLINICAL IMPRESSION(S) / ED DIAGNOSES  Final diagnoses:  COVID-19      NEW MEDICATIONS STARTED DURING THIS VISIT:  ED  Discharge Orders         Ordered    ondansetron (ZOFRAN ODT) 4 MG disintegrating tablet  Every 8 hours PRN        11/08/19 1754    benzonatate (TESSALON PERLES) 100 MG capsule  3 times daily PRN,   Status:  Discontinued        11/08/19 1754    albuterol (VENTOLIN HFA) 108 (90 Base) MCG/ACT inhaler  Every 6 hours PRN        11/08/19 1754    ketorolac (TORADOL) 10 MG tablet  Every 6 hours PRN        11/08/19 1754    benzonatate (TESSALON PERLES) 100 MG capsule  3 times daily PRN        11/08/19 1755              This chart was dictated using voice recognition software/Dragon. Despite best efforts to proofread, errors can occur which can change the meaning. Any change was purely unintentional.     Orvil Feil, PA-C 11/08/19 1759    Dionne Bucy, MD 11/08/19 [phone]

## 2019-11-08 NOTE — ED Triage Notes (Signed)
Head ache--from shoulders up feels sore--says had diarrhea and now she is congested.

## 2019-11-09 ENCOUNTER — Ambulatory Visit (HOSPITAL_COMMUNITY): Payer: Self-pay

## 2019-11-09 ENCOUNTER — Telehealth: Payer: Self-pay | Admitting: Family

## 2019-11-09 ENCOUNTER — Other Ambulatory Visit: Payer: Self-pay | Admitting: Family

## 2019-11-09 DIAGNOSIS — U071 COVID-19: Secondary | ICD-10-CM

## 2019-11-09 DIAGNOSIS — Z6835 Body mass index (BMI) 35.0-35.9, adult: Secondary | ICD-10-CM

## 2019-11-09 NOTE — Telephone Encounter (Signed)
Called to Discuss with patient about Covid symptoms and the use of the monoclonal antibody infusion for those with mild to moderate Covid symptoms and at a high risk of hospitalization.     Pt appears to qualify for this infusion due to co-morbid conditions and/or a member of an at-risk group in accordance with the FDA Emergency Use Authorization.   Brittany Braun was seen at [facility] ED on 8/30 with headache, diarrhea and nasal congestion with symptom onset 11/06/19. Risk factor is BMI >25.   Spoke with Brittany Braun and she would like to proceed with the infusion. Information discussed:  Hello Brittany Braun,   You have been scheduled to receive Regeneron (the monoclonal antibody we discussed) on : 11/10/19 at 11:30 am  If you have been tested outside of a [facility] - you MUST bring a copy of your positive test with you the morning of your appointment. You may take a photo of this and upload to your MyChart portal or have the testing facility fax the result to [phone]    The address for the infusion clinic site is:  --GPS address is [address] - the parking is located near [employer] building where you will see  COVID19 Infusion feather banner marking the entrance to parking.   (see photos below)            --Enter into the 2nd entrance where the "wave, flag banner" is at the road. Turn into this 2nd entrance and immediately turn left to park in 1 of the 5 parking spots.   --Please stay in your car and call the desk for assistance inside [phone].   --Average time in department is roughly 2 hours for Regeneron treatment - this includes preparation of the medication, IV start and the required 1 hour monitoring after the infusion.    Should you develop worsening shortness of breath, chest pain or severe breathing problems please do not wait for this appointment and go to the Emergency room for evaluation and treatment. You will undergo another oxygen screen before your  infusion to ensure this is the best treatment option for you. There is a chance that the best decision may be to send you to the Emergency Room for evaluation at the time of your appointment.   The day of your visit you should: Marland Kitchen Get plenty of rest the night before and drink plenty of water . Eat a light meal/snack before coming and take your medications as prescribed  . Wear warm, comfortable clothes with a shirt that can roll-up over the elbow (will need IV start).  . Wear a mask  . Consider bringing some activity to help pass the time  Many commercial insurers are waiving bills related to COVID treatment however some have ranged from $300-640. We are starting to see some insurers send bills to patients later for the administration of the medication - we are learning more information but you may receive a bill after your appointment.  Please contact your insurance agent to discuss prior to your appointment if you would like further details about billing specific to your policy.     Jeanine Luz, NP

## 2019-11-09 NOTE — Progress Notes (Signed)
I connected by phone with Brittany Braun on 11/09/2019 at 11:34 AM to discuss the potential use of a new treatment for mild to moderate COVID-19 viral infection in non-hospitalized patients.  This patient is a 24 y.o. female that meets the FDA criteria for Emergency Use Authorization of COVID monoclonal antibody casirivimab/imdevimab.  Has a (+) direct SARS-CoV-2 viral test result  Has mild or moderate COVID-19   Is NOT hospitalized due to COVID-19  Is within 10 days of symptom onset  Has at least one of the high risk factor(s) for progression to severe COVID-19 and/or hospitalization as defined in EUA.  Specific high risk criteria : BMI > 25   I have spoken and communicated the following to the patient or parent/caregiver regarding COVID monoclonal antibody treatment:  1. FDA has authorized the emergency use for the treatment of mild to moderate COVID-19 in adults and pediatric patients with positive results of direct SARS-CoV-2 viral testing who are 17 years of age and older weighing at least 40 kg, and who are at high risk for progressing to severe COVID-19 and/or hospitalization.  2. The significant known and potential risks and benefits of COVID monoclonal antibody, and the extent to which such potential risks and benefits are unknown.  3. Information on available alternative treatments and the risks and benefits of those alternatives, including clinical trials.  4. Patients treated with COVID monoclonal antibody should continue to self-isolate and use infection control measures (e.g., wear mask, isolate, social distance, avoid sharing personal items, clean and disinfect "high touch" surfaces, and frequent handwashing) according to CDC guidelines.   5. The patient or parent/caregiver has the option to accept or refuse COVID monoclonal antibody treatment.  After reviewing this information with the patient, The patient agreed to proceed with receiving casirivimab\imdevimab infusion  and will be provided a copy of the Fact sheet prior to receiving the infusion.   Jeanine Luz, NP 11/09/2019 11:34 AM

## 2019-11-10 ENCOUNTER — Ambulatory Visit (HOSPITAL_COMMUNITY): Payer: Self-pay | Attending: Pulmonary Disease

## 2019-11-11 ENCOUNTER — Ambulatory Visit (HOSPITAL_COMMUNITY): Payer: Self-pay

## 2019-12-20 ENCOUNTER — Encounter: Payer: Self-pay | Admitting: Obstetrics & Gynecology

## 2020-04-22 IMAGING — CR DG LUMBAR SPINE 2-3V
3 series · 3 of 3 positions shown · non-contrast
Comparison: None.

CLINICAL DATA: 22-year-old female with lumbar spine pain after
falling down the stairs earlier today

EXAM:
LUMBAR SPINE - 2-3 VIEW

[l-spine ap]
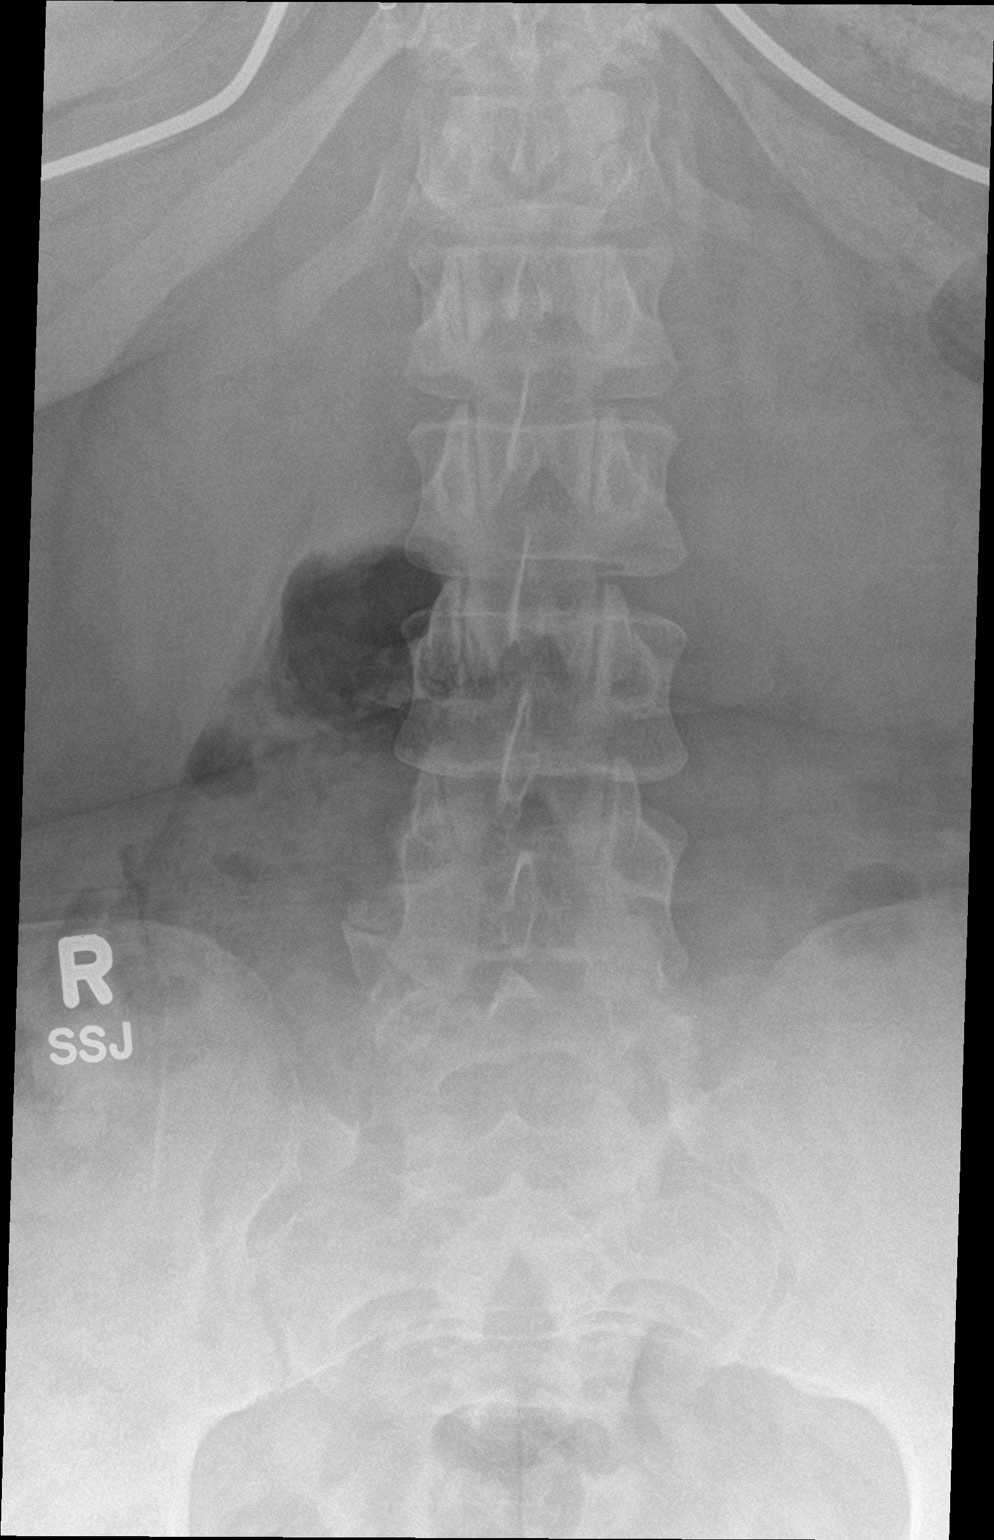

[l-spine lat]
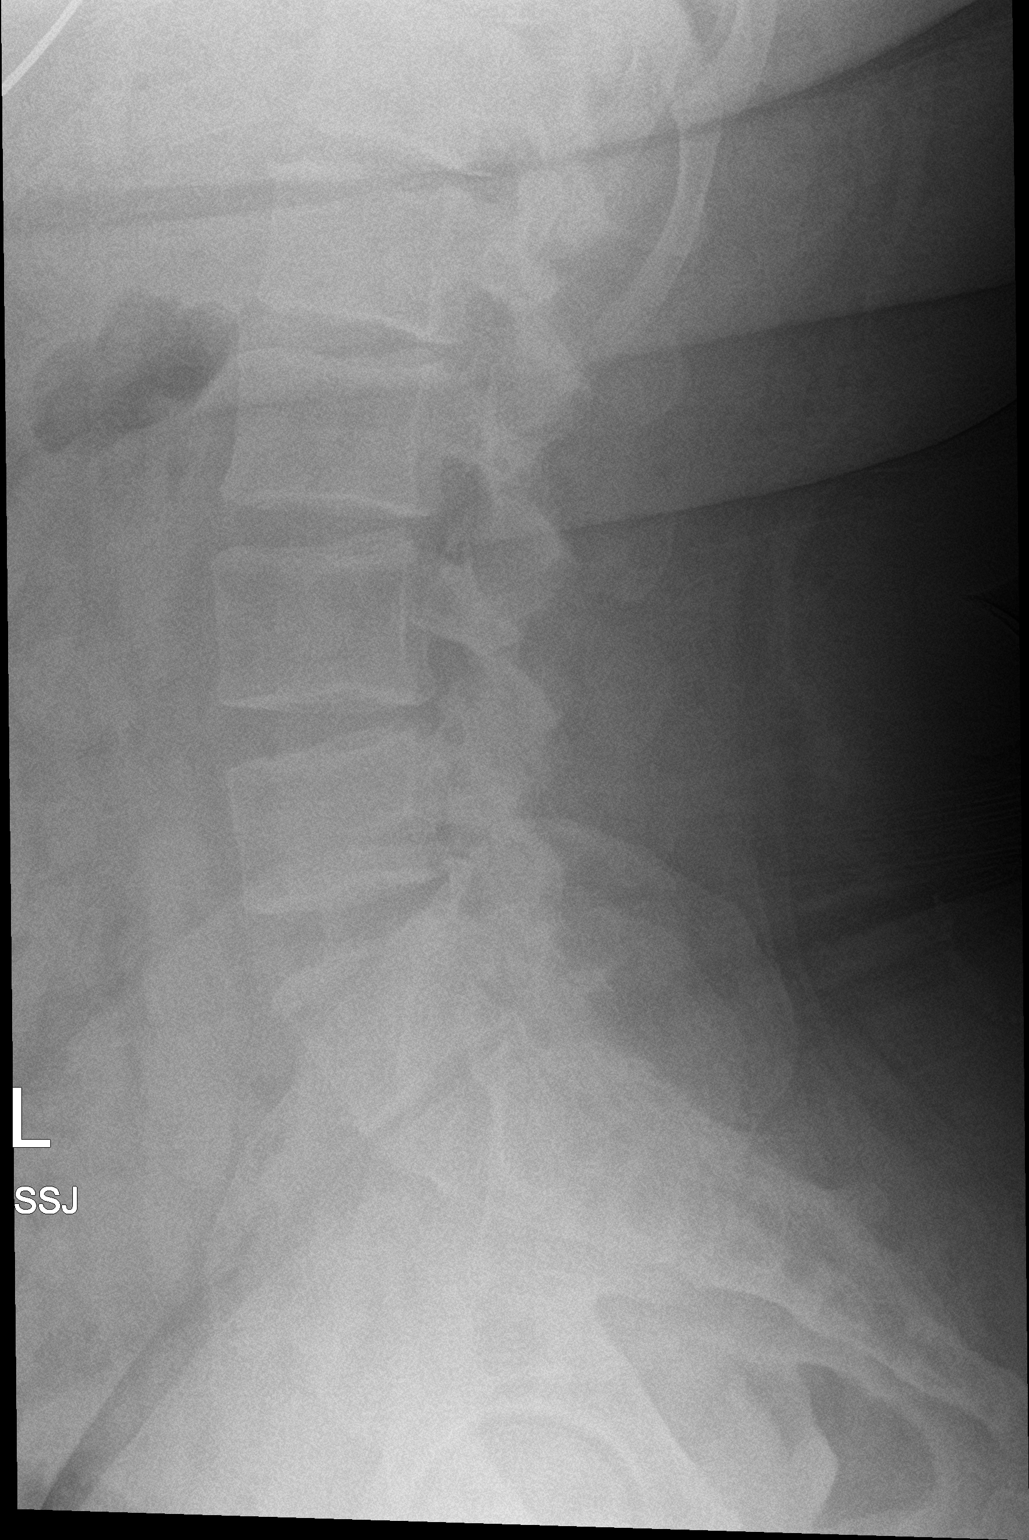

[l-spine spot]
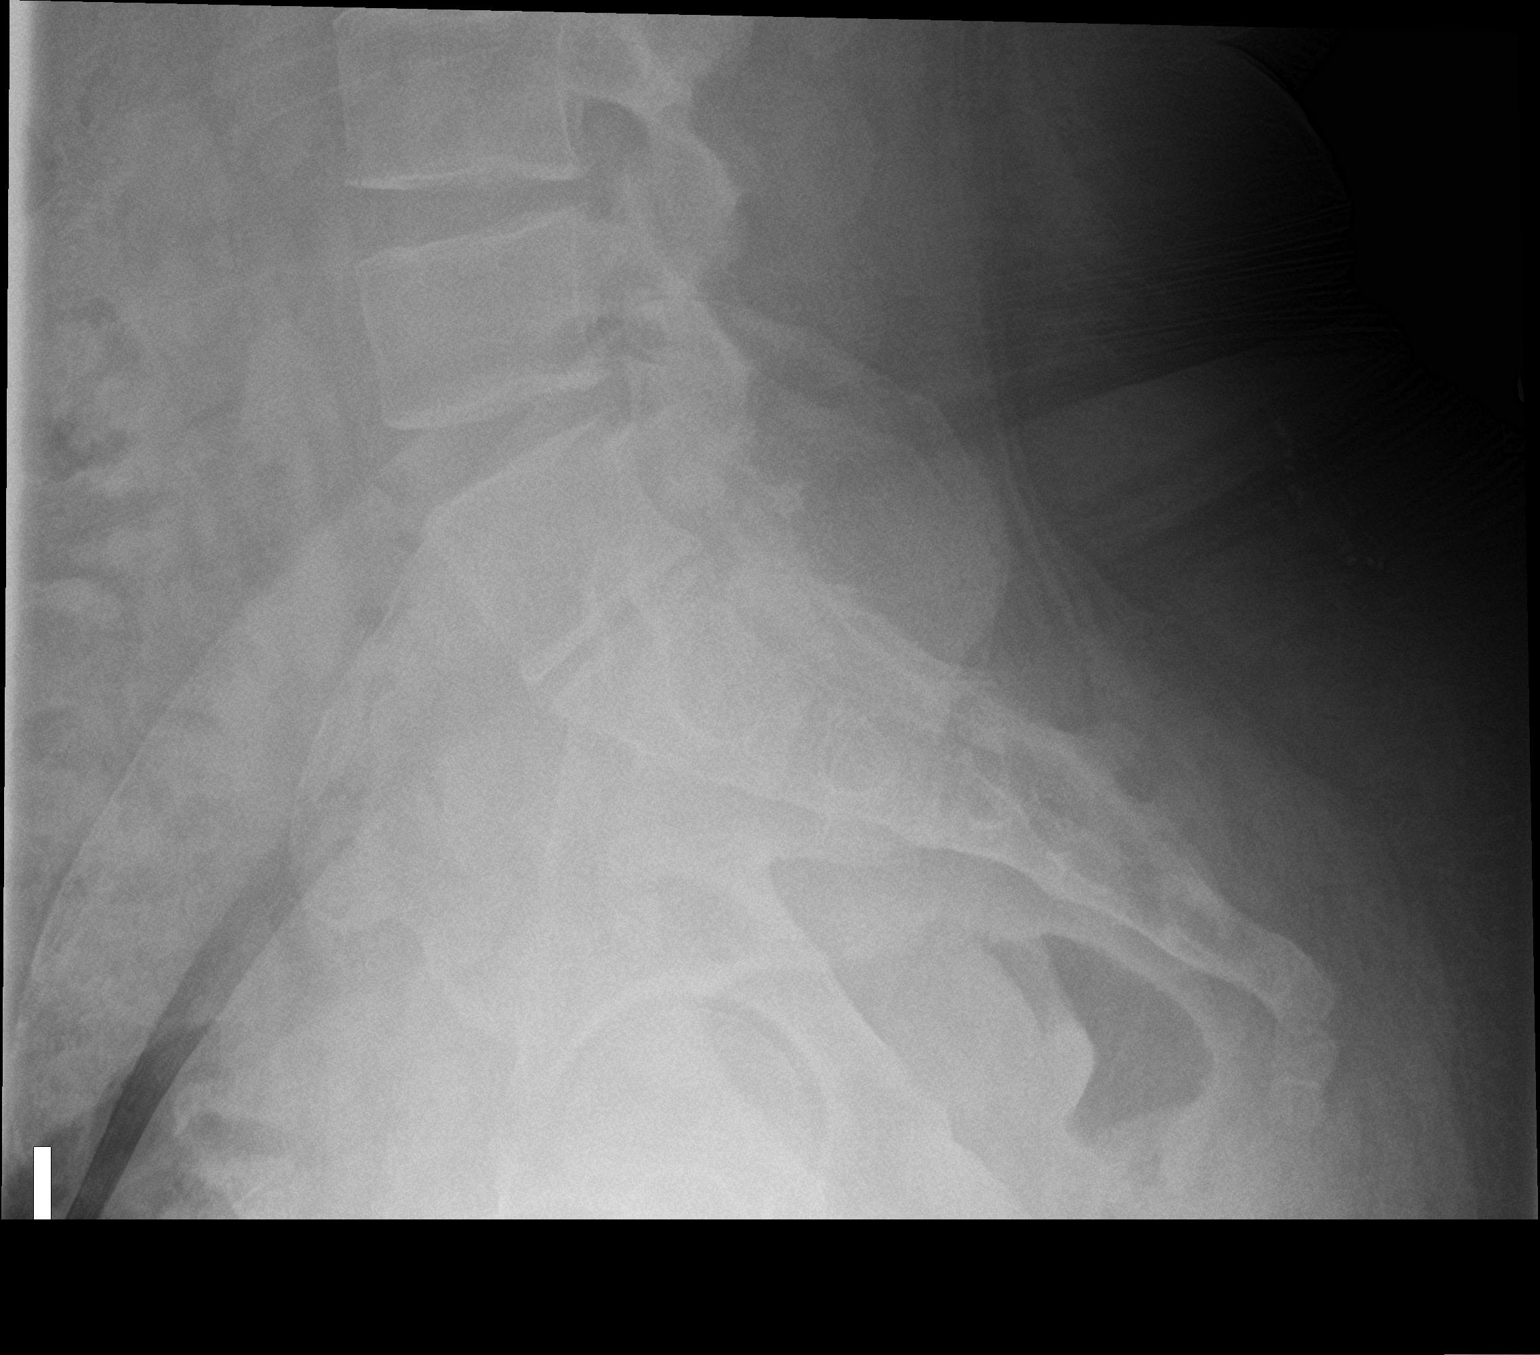

[3 of 3 positions shown; findings below may reference images not displayed]

FINDINGS: There is no evidence of lumbar spine fracture. Alignment is normal.
Intervertebral disc spaces are maintained.
IMPRESSION: Negative.

## 2023-02-26 ENCOUNTER — Other Ambulatory Visit: Payer: Self-pay

## 2023-02-26 ENCOUNTER — Emergency Department
Admission: EM | Admit: 2023-02-26 | Discharge: 2023-02-26 | Disposition: A | Discharge status: Home / Self Care | Payer: Self-pay | Attending: Emergency Medicine | Admitting: Emergency Medicine

## 2023-02-26 ENCOUNTER — Emergency Department: Payer: Self-pay

## 2023-02-26 DIAGNOSIS — Z1152 Encounter for screening for COVID-19: Secondary | ICD-10-CM | POA: Insufficient documentation

## 2023-02-26 DIAGNOSIS — J101 Influenza due to other identified influenza virus with other respiratory manifestations: Secondary | ICD-10-CM | POA: Insufficient documentation

## 2023-02-26 DIAGNOSIS — J111 Influenza due to unidentified influenza virus with other respiratory manifestations: Secondary | ICD-10-CM

## 2023-02-26 LAB — RESP PANEL BY RT-PCR (RSV, FLU A&B, COVID)  RVPGX2
Influenza A by PCR: POSITIVE — AB
Influenza B by PCR: NEGATIVE
Resp Syncytial Virus by PCR: NEGATIVE
SARS Coronavirus 2 by RT PCR: NEGATIVE

## 2023-02-26 MED ORDER — ONDANSETRON 4 MG PO TBDP: 4.0000 mg | ORAL_TABLET | Freq: Three times a day (TID) | ORAL | 0 refills | Status: AC | PRN

## 2023-02-26 MED ORDER — BENZONATATE 100 MG PO CAPS: 100.0000 mg | ORAL_CAPSULE | Freq: Three times a day (TID) | ORAL | 0 refills | Status: DC | PRN

## 2023-02-26 MED ORDER — ACETAMINOPHEN 500 MG PO TABS
1000.0000 mg | ORAL_TABLET | Freq: Once | ORAL | Status: AC
  Administered 2023-02-26: 1000 mg via ORAL
  Filled 2023-02-26: qty 2

## 2023-02-26 MED ORDER — OSELTAMIVIR PHOSPHATE 75 MG PO CAPS: 75.0000 mg | ORAL_CAPSULE | Freq: Two times a day (BID) | ORAL | 0 refills | Status: AC

## 2023-02-26 NOTE — ED Provider Notes (Signed)
[facility] Provider Note    None    (approximate)   History   Cough   HPI  Brittany Braun is a 27 y.o. female  who comes in with concern for cough and fever.  Symptoms started yesteday- otherwise healthy. Reports cough, fever, congestion.    Physical Exam   Triage Vital Signs: ED Triage Vitals  Encounter Vitals Group     BP 02/26/23 0931 116/77     Systolic BP Percentile --      Diastolic BP Percentile --      Pulse Rate 02/26/23 0931 (!) 102     Resp 02/26/23 0931 20     Temp 02/26/23 0931 (!) 100.9 F (38.3 C)     Temp Source 02/26/23 0931 Oral     SpO2 02/26/23 0931 97 %     Weight 02/26/23 0929 250 lb (113.4 kg)     Height 02/26/23 0929 5\' 6"  (1.676 m)     Head Circumference --      Peak Flow --      Pain Score 02/26/23 0929 0     Pain Loc --      Pain Education --      Exclude from Growth Chart --     Most recent vital signs: Vitals:   02/26/23 0931  BP: 116/77  Pulse: (!) 102  Resp: 20  Temp: (!) 100.9 F (38.3 C)  SpO2: 97%     General: Awake, no distress.  CV:  Good peripheral perfusion.  Resp:  Normal effort.  Abd:  No distention.  Other:     ED Results / Procedures / Treatments   Labs (all labs ordered are listed, but only abnormal results are displayed) Labs Reviewed  RESP PANEL BY RT-PCR (RSV, FLU A&B, COVID)  [id number] - Abnormal; Notable for the following components:      Result Value   Influenza A by PCR POSITIVE (*)    All other components within normal limits     RADIOLOGY I have reviewed the xray personally and interpretted and no PNA    PROCEDURES:  Critical Care performed: No  Procedures   MEDICATIONS ORDERED IN ED: Medications  acetaminophen (TYLENOL) tablet 1,000 mg (has no administration in time range)     IMPRESSION / MDM / ASSESSMENT AND PLAN / ED COURSE  I reviewed the triage vital signs and the nursing notes.   Patient's presentation is most consistent with acute  presentation with potential threat to life or bodily function.   Patient seen in triage due to extensive boarding.  Patient noted be febrile was flu positive.  X-ray without evidence of pneumonia.  Patient well-appearing otherwise and feels comfortable with symptomatic treatment at home.  She is within the window for Tamiflu and would elect to do this.    FINAL CLINICAL IMPRESSION(S) / ED DIAGNOSES   Final diagnoses:  Flu     Rx / DC Orders   ED Discharge Orders          Ordered    oseltamivir (TAMIFLU) 75 MG capsule  2 times daily        02/26/23 1042    ondansetron (ZOFRAN-ODT) 4 MG disintegrating tablet  Every 8 hours PRN        02/26/23 1042    benzonatate (TESSALON PERLES) 100 MG capsule  3 times daily PRN        02/26/23 1042  Note:  This document was prepared using Dragon voice recognition software and may include unintentional dictation errors.   Concha Se, MD 02/26/23 1051

## 2023-02-26 NOTE — ED Triage Notes (Signed)
Patient states productive cough since yesterday.

## 2023-04-23 ENCOUNTER — Emergency Department: Payer: Self-pay

## 2023-04-23 ENCOUNTER — Other Ambulatory Visit: Payer: Self-pay

## 2023-04-23 ENCOUNTER — Emergency Department
Admission: EM | Admit: 2023-04-23 | Discharge: 2023-04-23 | Disposition: A | Discharge status: Home / Self Care | Payer: Self-pay | Attending: Student in an Organized Health Care Education/Training Program | Admitting: Student in an Organized Health Care Education/Training Program

## 2023-04-23 DIAGNOSIS — N200 Calculus of kidney: Secondary | ICD-10-CM

## 2023-04-23 DIAGNOSIS — N202 Calculus of kidney with calculus of ureter: Secondary | ICD-10-CM | POA: Insufficient documentation

## 2023-04-23 DIAGNOSIS — R109 Unspecified abdominal pain: Secondary | ICD-10-CM

## 2023-04-23 LAB — CBC
HCT: 38.5 % (ref 36.0–46.0)
Hemoglobin: 12.4 g/dL (ref 12.0–15.0)
MCH: 28 pg (ref 26.0–34.0)
MCHC: 32.2 g/dL (ref 30.0–36.0)
MCV: 86.9 fL (ref 80.0–100.0)
Platelets: 319 10*3/uL (ref 150–400)
RBC: 4.43 MIL/uL (ref 3.87–5.11)
RDW: 14.3 % (ref 11.5–15.5)
WBC: 7.2 10*3/uL (ref 4.0–10.5)
nRBC: 0 % (ref 0.0–0.2)

## 2023-04-23 LAB — URINALYSIS, ROUTINE W REFLEX MICROSCOPIC
Bilirubin Urine: NEGATIVE
Glucose, UA: NEGATIVE mg/dL
Ketones, ur: NEGATIVE mg/dL
Nitrite: NEGATIVE
Protein, ur: NEGATIVE mg/dL
RBC / HPF: >50 RBC/hpf (ref 0–5)
Specific Gravity, Urine: 1.03 (ref 1.005–1.030)
pH: 5 (ref 5.0–8.0)

## 2023-04-23 LAB — COMPREHENSIVE METABOLIC PANEL
ALT: 12 U/L (ref 0–44)
AST: 24 U/L (ref 15–41)
Albumin: 4.1 g/dL (ref 3.5–5.0)
Alkaline Phosphatase: 63 U/L (ref 38–126)
Anion gap: 12 (ref 5–15)
BUN: 14 mg/dL (ref 6–20)
CO2: 23 mmol/L (ref 22–32)
Calcium: 9 mg/dL (ref 8.9–10.3)
Chloride: 102 mmol/L (ref 98–111)
Creatinine, Ser: 0.89 mg/dL (ref 0.44–1.00)
GFR, Estimated: >60 mL/min (ref >60)
Glucose, Bld: 104 mg/dL — ABNORMAL HIGH (ref 70–99)
Potassium: 4.9 mmol/L (ref 3.5–5.1)
Sodium: 137 mmol/L (ref 135–145)
Total Bilirubin: 1.1 mg/dL (ref 0.0–1.2)
Total Protein: 7.9 g/dL (ref 6.5–8.1)

## 2023-04-23 LAB — PREGNANCY, URINE: Preg Test, Ur: NEGATIVE

## 2023-04-23 LAB — LIPASE, BLOOD: Lipase: 23 U/L (ref 11–51)

## 2023-04-23 MED ORDER — ONDANSETRON 4 MG PO TBDP: 4.0000 mg | ORAL_TABLET | Freq: Three times a day (TID) | ORAL | 0 refills | Status: DC | PRN

## 2023-04-23 MED ORDER — OXYCODONE-ACETAMINOPHEN 5-325 MG PO TABS: 1.0000 | ORAL_TABLET | ORAL | 0 refills | Status: DC | PRN

## 2023-04-23 MED ORDER — ONDANSETRON HCL 4 MG/2ML IJ SOLN
4.0000 mg | Freq: Once | INTRAMUSCULAR | Status: AC
  Administered 2023-04-23: 4 mg via INTRAVENOUS
  Filled 2023-04-23: qty 2

## 2023-04-23 MED ORDER — IOHEXOL 300 MG/ML  SOLN
100.0000 mL | Freq: Once | INTRAMUSCULAR | Status: AC | PRN
  Administered 2023-04-23: 100 mL via INTRAVENOUS

## 2023-04-23 MED ORDER — MORPHINE SULFATE (PF) 4 MG/ML IV SOLN
4.0000 mg | INTRAVENOUS | Status: DC | PRN
  Administered 2023-04-23: 4 mg via INTRAVENOUS
  Filled 2023-04-23: qty 1

## 2023-04-23 MED ORDER — TAMSULOSIN HCL 0.4 MG PO CAPS: 0.4000 mg | ORAL_CAPSULE | Freq: Every day | ORAL | 0 refills | Status: AC

## 2023-04-23 MED ORDER — KETOROLAC TROMETHAMINE 30 MG/ML IJ SOLN
15.0000 mg | Freq: Once | INTRAMUSCULAR | Status: AC
  Administered 2023-04-23: 15 mg via INTRAVENOUS
  Filled 2023-04-23: qty 1

## 2023-04-23 NOTE — ED Provider Notes (Signed)
[facility] Provider Note    Event Date/Time   First MD Initiated Contact with Patient 04/23/23 1027     (approximate)   History   Abdominal Pain   HPI  Brittany Braun is a 28 y.o. female   to the ER for evaluation of acute left-sided flank pain and belly pain radiating to her groin.  Started this morning.  Abrupt in onset.  No measured fevers is having some nausea with it.  Having difficulty finding position of comfort.  Has never had pain like this before.  Denies any chance of being pregnant.  No known history of kidney stones.      Physical Exam   Triage Vital Signs: ED Triage Vitals  Encounter Vitals Group     BP 04/23/23 0955 130/86     Systolic BP Percentile --      Diastolic BP Percentile --      Pulse Rate 04/23/23 0955 (!) 57     Resp 04/23/23 0955 18     Temp 04/23/23 0955 99 F (37.2 C)     Temp src --      SpO2 04/23/23 0955 100 %     Weight 04/23/23 0953 250 lb (113.4 kg)     Height 04/23/23 0953 5\' 6"  (1.676 m)     Head Circumference --      Peak Flow --      Pain Score 04/23/23 0953 8     Pain Loc --      Pain Education --      Exclude from Growth Chart --     Most recent vital signs: Vitals:   04/23/23 0955 04/23/23 1420  BP: 130/86 130/70  Pulse: (!) 57 (!) 59  Resp: 18 18  Temp: 99 F (37.2 C) 97.9 F (36.6 C)  SpO2: 100% 100%     Constitutional: Alert  Eyes: Conjunctivae are normal.  Head: Atraumatic. Nose: No congestion/rhinnorhea. Mouth/Throat: Mucous membranes are moist.   Neck: Painless ROM.  Cardiovascular:   Good peripheral circulation. Respiratory: Normal respiratory effort.  No retractions.  Gastrointestinal: Soft and nontender.  Musculoskeletal:  no deformity Neurologic:  MAE spontaneously. No gross focal neurologic deficits are appreciated.  Skin:  Skin is warm, dry and intact. No rash noted. Psychiatric: Mood and affect are normal. Speech and behavior are normal.    ED Results /  Procedures / Treatments   Labs (all labs ordered are listed, but only abnormal results are displayed) Labs Reviewed  COMPREHENSIVE METABOLIC PANEL - Abnormal; Notable for the following components:      Result Value   Glucose, Bld 104 (*)    All other components within normal limits  URINALYSIS, ROUTINE W REFLEX MICROSCOPIC - Abnormal; Notable for the following components:   Color, Urine YELLOW (*)    APPearance HAZY (*)    Hgb urine dipstick MODERATE (*)    Leukocytes,Ua MODERATE (*)    Bacteria, UA RARE (*)    All other components within normal limits  LIPASE, BLOOD  CBC  PREGNANCY, URINE     EKG     RADIOLOGY Please see ED Course for my review and interpretation.  I personally reviewed all radiographic images ordered to evaluate for the above acute complaints and reviewed radiology reports and findings.  These findings were personally discussed with the patient.  Please see medical record for radiology report.    PROCEDURES:  Critical Care performed: No  Procedures   MEDICATIONS ORDERED IN ED: Medications  morphine (PF) 4 MG/ML injection 4 mg (4 mg Intravenous Given 04/23/23 1039)  ondansetron (ZOFRAN) injection 4 mg (4 mg Intravenous Given 04/23/23 1038)  iohexol (OMNIPAQUE) 300 MG/ML solution 100 mL (100 mLs Intravenous Contrast Given 04/23/23 1318)  ketorolac (TORADOL) 30 MG/ML injection 15 mg (15 mg Intravenous Given 04/23/23 1416)     IMPRESSION / MDM / ASSESSMENT AND PLAN / ED COURSE  I reviewed the triage vital signs and the nursing notes.                              Differential diagnosis includes, but is not limited to, stone, pyelonephritis, diverticulitis, colitis, appendicitis, ovarian pathology  Patient presenting to the ER for evaluation of symptoms as described above.  Based on symptoms, risk factors and considered above differential, this presenting complaint could reflect a potentially life-threatening illness therefore the patient will be  placed on continuous pulse oximetry and telemetry for monitoring.  Laboratory evaluation will be sent to evaluate for the above complaints.      Clinical Course as of 04/23/23 1511  Wed Apr 23, 2023  1356 Imaging on my review and interpretation of the CT of the abdomen appears consistent with acute left-sided kidney stone.  No sign of infection.  Ordered Toradol. [PR]    Clinical Course User Index [PR] Willy Eddy, MD   Patient signed out to oncoming physician pending follow-up formal radiology report on CT abdomen.   FINAL CLINICAL IMPRESSION(S) / ED DIAGNOSES   Final diagnoses:  Kidney stone  Left flank pain     Rx / DC Orders   ED Discharge Orders          Ordered    oxyCODONE-acetaminophen (PERCOCET) 5-325 MG tablet  Every 4 hours PRN        04/23/23 1504    ondansetron (ZOFRAN-ODT) 4 MG disintegrating tablet  Every 8 hours PRN        04/23/23 1504             Note:  This document was prepared using Dragon voice recognition software and may include unintentional dictation errors.    Willy Eddy, MD 04/23/23 [phone]

## 2023-04-23 NOTE — ED Triage Notes (Signed)
Pt comes via [facility] with c/o belly pain that started on her way to work. Pt states left flank pain that radiates. Pt does have nausea.

## 2023-04-23 NOTE — ED Provider Notes (Signed)
3:34 PM Assumed care for off going team.   Blood pressure 130/70, pulse (!) 59, temperature 97.9 F (36.6 C), temperature source Oral, resp. rate 18, height 5\' 6"  (1.676 m), weight 113.4 kg, last menstrual period 04/14/2023, SpO2 100%.  See their HPI for full report but in brief penidng CT and dc   IMPRESSION:  1. There is a 2 x 3 x 4 mm left vesico ureteric junction calculus  causing mild proximal obstructive uropathy.  2. No other nephroureterolithiasis on either side. No right  obstructive uropathy.  3. Multiple other nonacute observations, as described above.     3:46 PM  Patient had a mild amount of white cells in her urine but no bacteria mostly just sloughing from the kidney stone.  She is afebrile with normal white count do not feel this represents a UTI will send for culture out of precaution but hold off on antibiotics at this time.  Discussed with patient returning if she develops any fever.  Patient expressed understanding and felt comfortable with discharge home.   Concha Se, MD 04/23/23 [phone]

## 2023-04-23 NOTE — Discharge Instructions (Addendum)
You have a kidney stone. See report below.   Take ibuprofen 600mg  every 8 hours daily (as long as you are not on any other blood thinners or have kidney disease) Take oxycodone for breakthrough pain. Do not drive, work, or operate machinery while on this.  Take zofran to help with nausea. Take Flomax to help dilate uretha. Call urology number above to schedule outpatient appointment. Return to ED for fevers, unable to keep food down, or any other concerns.    IMPRESSION:  1. There is a 2 x 3 x 4 mm left vesico ureteric junction calculus  causing mild proximal obstructive uropathy.  2. No other nephroureterolithiasis on either side. No right  obstructive uropathy.  3. Multiple other nonacute observations, as described above.

## 2023-04-23 NOTE — ED Triage Notes (Signed)
First Nurse Note:  Pt via [facility] from work. Pt c/o sudden onset of L flank pain that radiates LLQ abd pain. Endorses nausea. Denies hx of kidney stone. Pt is A&Ox4 and NAD EMS gave 1g of Tylenol IV  18G L AC. 148/90 BP  100% on RA 80 HR

## 2023-04-25 LAB — URINE CULTURE: Culture: NO GROWTH

## 2023-05-26 ENCOUNTER — Ambulatory Visit: Payer: Self-pay

## 2023-05-26 ENCOUNTER — Encounter: Payer: Self-pay | Admitting: Nurse Practitioner

## 2023-05-26 DIAGNOSIS — A599 Trichomoniasis, unspecified: Secondary | ICD-10-CM

## 2023-05-26 DIAGNOSIS — Z113 Encounter for screening for infections with a predominantly sexual mode of transmission: Secondary | ICD-10-CM

## 2023-05-26 DIAGNOSIS — F32A Depression, unspecified: Secondary | ICD-10-CM

## 2023-05-26 LAB — HM HIV SCREENING LAB: HM HIV Screening: NEGATIVE

## 2023-05-26 LAB — HEPATITIS B SURFACE ANTIGEN

## 2023-05-26 LAB — WET PREP FOR TRICH, YEAST, CLUE
Trichomonas Exam: POSITIVE — AB
Yeast Exam: NEGATIVE

## 2023-05-26 LAB — HM HEPATITIS C SCREENING LAB: HM Hepatitis Screen: NEGATIVE

## 2023-05-26 MED ORDER — METRONIDAZOLE 500 MG PO TABS: 500.0000 mg | ORAL_TABLET | Freq: Two times a day (BID) | ORAL | Status: AC

## 2023-05-26 NOTE — Progress Notes (Signed)
 Pt is here today for STD screening. Wet prep results reviewed with patient. The patient was dispensed metronidazole 500 MG 2x/day for 7 days today. I provided counseling today regarding the medication. We discussed the medication, the side effects and when to call clinic. Patient given the opportunity to ask questions for any clarifications. Condoms declined, contact given and brochure given. Sonda Primes, RN.

## 2023-05-31 LAB — GONOCOCCUS CULTURE

## 2023-06-05 NOTE — Progress Notes (Signed)
 [facility] Department STI clinic [address] [place] [address] Main phone: [phone]  STI screening visit  Subjective:  Brittany Braun is a 28 y.o. female being seen today for an STI screening visit. The patient reports they do have symptoms.  Patient reports that they do not desire a pregnancy in the next year.   They reported they are not interested in discussing contraception today.    Patient's last menstrual period was 05/02/2023.  Patient has the following medical conditions:  There are no active problems to display for this patient.   Chief Complaint  Patient presents with  . SEXUALLY TRANSMITTED DISEASE    Pt is here for STD screening and has symptoms   Patient is a pleasant 28 y.o. female who presents to the office today requesting symptomatic STI testing.  Patient reports genital itching that started 1 month ago and resolved but has since returned. She reports using vaginal cream at home with relief. She also indicates increased white discharge that is thick without odor for 2 months. She indicates pain to the external vaginal skin with urination that occurs intermittently. And patient reports she noticed a lesion to the vaginal area this morning.  Patient indicates 1 female partner in the last 2 months. She reports practicing vaginal and oral sex and does not use condoms. Patient indicates no history of STIs. Patient reports last sex was 10 days ago. She indicates no use of contraception method.  Patient indicates LMP was 05/02/23 and has periods monthly.     Does the patient using douching products? Not assessed.  Last HIV test per patient/review of record was  Lab Results  Component Value Date   HMHIVSCREEN Negative - Validated 05/26/2023    Last HEPC test per patient/review of record was  Lab Results  Component Value Date   HMHEPCSCREEN Negative-Validated 05/26/2023    Last HEPB test per patient/review of record was No  components found for: "HMHEPBSCREEN"   Patient reports last pap was:   No results found for: "DIAGPAP", "HPVHIGH", "ADEQPAP" No results found for: "SPECADGYN" No Cervical Cancer Screening results to display.  Screening for MPX risk: Does the patient have an unexplained rash? No Is the patient MSM? No Does the patient endorse multiple sex partners or anonymous sex partners? No Did the patient have close or sexual contact with a person diagnosed with MPX? No Has the patient traveled outside the [place] where MPX is endemic? No Is there a high clinical suspicion for MPX-- evidenced by one of the following No  -Unlikely to be chickenpox  -Lymphadenopathy  -Rash that present in same phase of evolution on any given body part See flowsheet for further details and programmatic requirements.   Immunization history:  Immunization History  Administered Date(s) Administered  . Tdap 04/02/2015     The following portions of the patient's history were reviewed and updated as appropriate: allergies, current medications, past medical history, past social history, past surgical history and problem list.  Objective:  There were no vitals filed for this visit.  Physical Exam Nursing note reviewed.  Constitutional:      Appearance: Normal appearance.  HENT:     Head: Normocephalic.     Salivary Glands: Right salivary gland is not diffusely enlarged or tender. Left salivary gland is not diffusely enlarged or tender.     Mouth/Throat:     Lips: Pink. No lesions.     Mouth: Mucous membranes are moist.  Tongue: No lesions. Tongue does not deviate from midline.     Pharynx: Oropharynx is clear. Uvula midline.     Tonsils: No tonsillar exudate.  Eyes:     General:        Right eye: No discharge.        Left eye: No discharge.  Pulmonary:     Effort: Pulmonary effort is normal.  Genitourinary:    General: Normal vulva.     Exam position: Lithotomy position.     Pubic Area: No rash or pubic  lice.      Tanner stage (genital): 5.     Labia:        Right: No rash, tenderness, lesion or injury.        Left: No rash, tenderness, lesion or injury.      Vagina: Vaginal discharge, erythema, tenderness and lesions present. No bleeding.     Cervix: Discharge and erythema present. No cervical motion tenderness, friability, lesion, cervical bleeding or eversion.     Uterus: Normal.      Adnexa: Right adnexa normal and left adnexa normal.          Comments: pH<4.5, white, frothy, non-odorous vaginal discharge present. Multiple small erythematous lesions present to the external vaginal labia.  Multiple erythematous pinpoint hemorrhages on cervix consistent with "strawberry cervix" appearance.  Lymphadenopathy:     Head:     Right side of head: No submental, submandibular, tonsillar, preauricular or posterior auricular adenopathy.     Left side of head: No submental, submandibular, tonsillar, preauricular or posterior auricular adenopathy.     Cervical: No cervical adenopathy.     Right cervical: No superficial or posterior cervical adenopathy.    Left cervical: No superficial or posterior cervical adenopathy.     Upper Body:     Right upper body: No supraclavicular or axillary adenopathy.     Left upper body: No supraclavicular or axillary adenopathy.     Lower Body: No right inguinal adenopathy. No left inguinal adenopathy.  Skin:    General: Skin is warm and dry.     Findings: No rash.     Comments: Skin tone appropriate for ethnicity.   Neurological:     Mental Status: She is alert and oriented to person, place, and time.  Psychiatric:        Attention and Perception: Attention and perception normal.        Mood and Affect: Mood and affect normal.        Speech: Speech normal.        Behavior: Behavior normal. Behavior is cooperative.        Thought Content: Thought content normal.    Assessment and Plan:  Brittany Braun is a 28 y.o. female presenting to the [facility] Department for STI screening  1. Screening for venereal disease (Primary)  - Chlamydia/Gonorrhea [place] [facility] - Gonococcus culture - HBV Antigen/Antibody [facility] - HIV/HCV [place] [facility] - WET PREP FOR TRICH, YEAST, CLUE - Syphilis Serology, [place] [facility] - Virology, [facility]  2. Trichomonas infection Wet prep positive for trichomoniasis in office today. Treating per CDC guidelines.  - metroNIDAZOLE (FLAGYL) 500 MG tablet; Take 1 tablet (500 mg total) by mouth 2 (two) times daily for 7 days.  3. Depression, unspecified depression type Patient requests referral to counseling.  - Ambulatory referral to [facility]  Patient accepted all screenings including oral, vaginal CT/GC and bloodwork for HIV/RPR, and wet prep. Patient  meets criteria for HepB screening? Yes. Ordered? yes Patient meets criteria for HepC screening? Yes. Ordered? yes  Treat wet prep per standing order Discussed time line for [facility] results and that patient will be called with positive results and encouraged patient to call if she had not heard in 2 weeks.  Counseled to return or seek care for continued or worsening symptoms Recommended repeat testing in 3 months with positive results. Recommended condom use with all sex for STI prevention.   Patient is currently using  no method  to prevent pregnancy.    Return if symptoms worsen or fail to improve.  No future appointments.  Total time with patient 30 minutes.   Edmonia James, NP

## 2023-07-28 ENCOUNTER — Ambulatory Visit: Payer: Self-pay | Admitting: Family Medicine

## 2023-07-28 ENCOUNTER — Telehealth: Payer: Self-pay | Admitting: Family Medicine

## 2023-07-28 DIAGNOSIS — A599 Trichomoniasis, unspecified: Secondary | ICD-10-CM

## 2023-07-28 DIAGNOSIS — B3731 Acute candidiasis of vulva and vagina: Secondary | ICD-10-CM | POA: Insufficient documentation

## 2023-07-28 DIAGNOSIS — Z113 Encounter for screening for infections with a predominantly sexual mode of transmission: Secondary | ICD-10-CM

## 2023-07-28 LAB — WET PREP FOR TRICH, YEAST, CLUE
Clue Cell Exam: NEGATIVE
Trichomonas Exam: POSITIVE — AB
Yeast Exam: NEGATIVE

## 2023-07-28 LAB — HM HIV SCREENING LAB: HM HIV Screening: NEGATIVE

## 2023-07-28 LAB — HM HEPATITIS C SCREENING LAB: HM Hepatitis Screen: NEGATIVE

## 2023-07-28 MED ORDER — FLUCONAZOLE 150 MG PO TABS: 150.0000 mg | ORAL_TABLET | Freq: Once | ORAL | Status: AC

## 2023-07-28 MED ORDER — METRONIDAZOLE 500 MG PO TABS: 500.0000 mg | ORAL_TABLET | Freq: Two times a day (BID) | ORAL | Status: AC

## 2023-07-28 NOTE — Patient Instructions (Signed)
 STI screening - Today we obtained a vaginal swab to screen for gonorrhea, chlamydia, and trichomonas and oral swab to screen for gonorrhea - We also obtained a blood sample to screen for HIV, syphilis, and Hepatitis C  - If the results are abnormal, I will give you a call.    Estimated time frame for results collected at the [facility] Department: Same day Trichomonas Yeast BV (bacterial vaginosis)  Within 1-2 weeks Gonorrhea Chlamydia  Within 2-3 weeks HIV Syphilis Hepatitis B Hepatitis C    Therapy You can use the following website to start looking for a therapist. Remember, finding a therapist you click with it a lot like speed dating - you have to sort through a bunch of lemons before finding what you like!  Nyle Belling, LCSW Licensed Clinical Social Worker at [facility] Department  Phone: [phone] [address], [address], [place] [address]  [email or web address] Access to care line (24/7) [phone] Treatment information  Crisis assistance  Mobile crisis team connection Mental health needs  Substance use disorder  Intellectual and developmental disabilities  [facility] For children and teens who have experienced the death of a loved one Phone: [phone] [address], [place] [address] [email or web address]  [email or web address]  Search for [place], [place] (or city of your choice).  On the next page, you will see filter options at the top.  You may filter therapists by insurance they take, issues you would like to work on, or therapy types.   [email or web address]

## 2023-07-28 NOTE — Telephone Encounter (Signed)
 When completing Ms. Brittany Braun' encounter note from this morning, I realized I had forgotten to order her fluconazole  150 mg tablet during the time of her visit. She is eligible to get this dispensed in person at [facility] if she wishes to return. I can also send to a pharmacy. I called to get her preference, but there was no answer. Left HIPAA safe VM without PHI.  Tempie Fee, MD 07/28/23  12:36 PM

## 2023-07-28 NOTE — Progress Notes (Signed)
 Pt is here for std screening. Wet prep results reviewed with pt and treated per standing order. Contact card declined, states no new partners. The patient was dispensed metronidazole #14 today. I provided counseling today regarding the medication. We discussed the medication, the side effects and when to call clinic. Patient given the opportunity to ask questions. Questions answered. Caren Channel, RN

## 2023-07-28 NOTE — Assessment & Plan Note (Signed)
 Given HPI and appearance of labia minora, will treat as vulvovaginal candidiasis. Vaginal swab negative for yeast, but did not include labia. Rx fluconazole  x1.
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
## 2023-07-28 NOTE — Assessment & Plan Note (Signed)
 Retreated today with metronidazole . Will need TOC no sooner than 3 months from now (on or after 10/28/23).

## 2023-08-02 LAB — GONOCOCCUS CULTURE

## 2023-12-12 ENCOUNTER — Ambulatory Visit: Payer: Self-pay

## 2023-12-12 DIAGNOSIS — Z113 Encounter for screening for infections with a predominantly sexual mode of transmission: Secondary | ICD-10-CM

## 2023-12-12 LAB — WET PREP FOR TRICH, YEAST, CLUE
Clue Cell Exam: NEGATIVE
Trichomonas Exam: NEGATIVE
Yeast Exam: NEGATIVE

## 2023-12-12 LAB — HM HIV SCREENING LAB: HM HIV Screening: NEGATIVE
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
## 2023-12-12 NOTE — Progress Notes (Signed)
 Pt is here for STD screening. Wet results reviewed with patient and required no treatment per SO. Condoms declined, Wilkie Drought, Charity fundraiser.

## 2024-01-05 ENCOUNTER — Other Ambulatory Visit: Payer: Self-pay

## 2024-01-05 ENCOUNTER — Emergency Department
Admission: EM | Admit: 2024-01-05 | Discharge: 2024-01-05 | Disposition: A | Discharge status: Home / Self Care | Payer: Self-pay | Attending: Emergency Medicine | Admitting: Emergency Medicine

## 2024-01-05 DIAGNOSIS — J069 Acute upper respiratory infection, unspecified: Secondary | ICD-10-CM | POA: Insufficient documentation

## 2024-01-05 LAB — RESP PANEL BY RT-PCR (RSV, FLU A&B, COVID)  RVPGX2
Influenza A by PCR: NEGATIVE
Influenza B by PCR: NEGATIVE
Resp Syncytial Virus by PCR: NEGATIVE
SARS Coronavirus 2 by RT PCR: NEGATIVE

## 2024-01-05 LAB — GROUP A STREP BY PCR: Group A Strep by PCR: NOT DETECTED

## 2024-01-05 NOTE — Discharge Instructions (Signed)
 Your COVID/flu/RSV swab is negative.  Your strep test is negative.  Please follow-up with your outpatient provider.  Please return for any new, worsening, or change in symptoms or other concerns.  He may continue to take Tylenol /ibuprofen  per package instructions to help with your symptoms.

## 2024-01-05 NOTE — ED Provider Notes (Signed)
 [facility] Provider Note    Event Date/Time   First MD Initiated Contact with Patient 01/05/24 1051     (approximate)   History   Sore Throat   HPI  Grissel R Devall is a 28 y.o. female who presents today for evaluation of sore throat, nasal congestion, and feeling warm since yesterday.  She denies fever.  Reports mild cough.  No chest pain or shortness of breath.  No abdominal pain, nausea, vomiting, diarrhea.  Patient Active Problem List   Diagnosis Date Noted   Vulvovaginal candidiasis 07/28/2023   Trichomonas infection 07/28/2023          Physical Exam   Triage Vital Signs: ED Triage Vitals [01/05/24 1039]  Encounter Vitals Group     BP 121/83     Girls Systolic BP Percentile      Girls Diastolic BP Percentile      Boys Systolic BP Percentile      Boys Diastolic BP Percentile      Pulse Rate 70     Resp 18     Temp 98 F (36.7 C)     Temp src      SpO2 100 %     Weight 246 lb (111.6 kg)     Height 5' 6 (1.676 m)     Head Circumference      Peak Flow      Pain Score 8     Pain Loc      Pain Education      Exclude from Growth Chart     Most recent vital signs: Vitals:   01/05/24 1039  BP: 121/83  Pulse: 70  Resp: 18  Temp: 98 F (36.7 C)  SpO2: 100%    Physical Exam Vitals and nursing note reviewed.  Constitutional:      General: Awake and alert. No acute distress.    Appearance: Normal appearance. The patient is normal weight.  HENT:     Head: Normocephalic and atraumatic.     Mouth: Mucous membranes are moist. Uvula midline.  No tonsillar exudate.  No soft palate fluctuance.  No trismus.  No voice change.  No sublingual swelling.  No tender cervical lymphadenopathy.  No nuchal rigidity Eyes:     General: PERRL. Normal EOMs        Right eye: No discharge.        Left eye: No discharge.     Conjunctiva/sclera: Conjunctivae normal.  Cardiovascular:     Rate and Rhythm: Normal rate and regular rhythm.      Pulses: Normal pulses. Pulmonary:     Effort: Pulmonary effort is normal. No respiratory distress.     Breath sounds: Normal breath sounds.  Abdominal:     Abdomen is soft. There is no abdominal tenderness. No rebound or guarding. No distention. Musculoskeletal:        General: No swelling. Normal range of motion.     Cervical back: Normal range of motion and neck supple.  Skin:    General: Skin is warm and dry.     Capillary Refill: Capillary refill takes less than 2 seconds.     Findings: No rash.  Neurological:     Mental Status: The patient is awake and alert.      ED Results / Procedures / Treatments   Labs (all labs ordered are listed, but only abnormal results are displayed) Labs Reviewed  RESP PANEL BY RT-PCR (RSV, FLU A&B, COVID)  [id number]  GROUP A  STREP BY PCR     EKG     RADIOLOGY     PROCEDURES:  Critical Care performed:   Procedures   MEDICATIONS ORDERED IN ED: Medications - No data to display   IMPRESSION / MDM / ASSESSMENT AND PLAN / ED COURSE  I reviewed the triage vital signs and the nursing notes.   Differential diagnosis includes, but is not limited to, COVID, influenza, URI, strep.  Patient is awake and alert, nontoxic in appearance.  She is afebrile.  She has a normal oxygen saturation of 99% on room air and demonstrates no decreased work of breathing.  Swabs obtained in triage are overall reassuring.  Symptoms of sore throat, cough, nasal congestion are most consistent with URI.  Uvula is midline, no tonsillar exudate, no voice change, no trismus, no drooling, no neck pain, do not suspect peritonsillar or retropharyngeal abscess.  Lungs are clear to auscultation bilaterally, normal oxygen saturation on room air, no fever, do not suspect pneumonia.  We discussed symptomatic management and return precautions.  We discussed return precautions and outpatient follow-up.  Patient understands and agrees with plan.  Discharged in stable  condition.   Patient's presentation is most consistent with acute complicated illness / injury requiring diagnostic workup.    FINAL CLINICAL IMPRESSION(S) / ED DIAGNOSES   Final diagnoses:  Upper respiratory tract infection, unspecified type     Rx / DC Orders   ED Discharge Orders     None        Note:  This document was prepared using Dragon voice recognition software and may include unintentional dictation errors.   Tomiko Schoon E, PA-C 01/05/24 1405    Levander Slate, MD 01/05/24 [phone]

## 2024-01-05 NOTE — ED Notes (Signed)
 See triage note  Presents with sore throat   States unsure of fever but has had some sweats  Afebrile on arrival

## 2024-01-05 NOTE — ED Triage Notes (Signed)
 Pt comes with sore throat, and sweats since yesterday. Pt states worse today.

## 2024-02-17 ENCOUNTER — Emergency Department
Admission: EM | Admit: 2024-02-17 | Discharge: 2024-02-17 | Disposition: A | Discharge status: Home / Self Care | Payer: Self-pay

## 2024-02-17 ENCOUNTER — Emergency Department: Payer: Self-pay

## 2024-02-17 ENCOUNTER — Other Ambulatory Visit: Payer: Self-pay

## 2024-02-17 DIAGNOSIS — M25532 Pain in left wrist: Secondary | ICD-10-CM

## 2024-02-17 MED ORDER — MELOXICAM 15 MG PO TABS: 15.0000 mg | ORAL_TABLET | Freq: Every day | ORAL | 0 refills | Status: AC

## 2024-02-17 NOTE — Discharge Instructions (Addendum)
 You were evaluated in the ED for left wrist pain.  Your x-ray is normal and shows no fracture or bony abnormality.  Your presentation is consistent with carpal tunnel syndrome.  We have provided you with a wrist brace and encouraged to wear especially at night for your comfort.  If symptoms persist please follow-up with orthopedic for further evaluation.  In the interim take prescribed medication meloxicam  for pain.  If any new or worsening symptoms occur return to ED for further evaluation.

## 2024-02-17 NOTE — ED Triage Notes (Signed)
 Pt arrives via POV for L wrist pain x2 days. Pt describes the pain as a sharp and shooting pain. Pt reports L hand starting to numb up since last night.

## 2024-02-17 NOTE — ED Provider Notes (Signed)
 [facility] Emergency Department Provider Note     Event Date/Time   First MD Initiated Contact with Patient 02/17/24 1519     (approximate)   History   Wrist Pain   HPI  Brittany Braun is a 28 y.o. female with a past medical history of anxiety presents to the ED for evaluation of left wrist pain and numbness described as a intermittent shooting sensation to her elbow x 2 days.  She denies injuries or falls.  She has taken ibuprofen  with some relief but symptoms returned.  No other complaint.  Patient has never experienced this pain before.  Patient is right-hand dominant.     Physical Exam   Triage Vital Signs: ED Triage Vitals  Encounter Vitals Group     BP 02/17/24 1255 121/83     Girls Systolic BP Percentile --      Girls Diastolic BP Percentile --      Boys Systolic BP Percentile --      Boys Diastolic BP Percentile --      Pulse Rate 02/17/24 1255 66     Resp 02/17/24 1255 18     Temp 02/17/24 1255 99.4 F (37.4 C)     Temp Source 02/17/24 1255 Oral     SpO2 02/17/24 1255 99 %     Weight 02/17/24 1254 240 lb (108.9 kg)     Height 02/17/24 1254 5' 6 (1.676 m)     Head Circumference --      Peak Flow --      Pain Score 02/17/24 1345 9     Pain Loc --      Pain Education --      Exclude from Growth Chart --     Most recent vital signs: Vitals:   02/17/24 1255  BP: 121/83  Pulse: 66  Resp: 18  Temp: 99.4 F (37.4 C)  SpO2: 99%    General Awake, no distress.  HEENT NCAT.  CV:  Good peripheral perfusion.  RESP:  Normal effort. ABD:  No distention.  Other:  No visible deformity to left hand or wrist.  Mild tenderness over thenar eminence palm side.  No scaphoid tenderness.  Patient is able to perform thumb opposition.  Capillary refill brisk and normal.  Radial pulses 2 +. (+) Tinels sign.    ED Results / Procedures / Treatments   Labs (all labs ordered are listed, but only abnormal results are displayed) Labs Reviewed  - No data to display  RADIOLOGY  I personally viewed and evaluated these images as part of my medical decision making, as well as reviewing the written report by the radiologist.  ED Provider Interpretation: No acute bony abnormality noted  DG Wrist Complete Left Result Date: 02/17/2024 CLINICAL DATA:  Wrist pain. EXAM: LEFT WRIST - COMPLETE 3+ VIEW COMPARISON:  None Available. FINDINGS: There is no evidence of fracture or dislocation. There is no evidence of arthropathy or other focal bone abnormality. Soft tissues are unremarkable. IMPRESSION: Negative. Electronically Signed   By: Vanetta Chou M.D.   On: 02/17/2024 15:51    PROCEDURES:  Critical Care performed: No  Procedures   MEDICATIONS ORDERED IN ED: Medications - No data to display   IMPRESSION / MDM / ASSESSMENT AND PLAN / ED COURSE  I reviewed the triage vital signs and the nursing notes.  Clinical Course as of 02/17/24 1609  Tue Feb 17, 2024  1605 DG Wrist Complete Left IMPRESSION: Negative.   [MH]    Clinical Course User Index [MH] Margrette Monte A, PA-C    28 y.o. female presents to the emergency department for evaluation and treatment of acute left wrist pain and intermittent paresthesias. See HPI for further details.   Differential diagnosis includes, but is not limited to fracture, sprain, strain, carpal tunnel syndrome  Patient's presentation is most consistent with acute complicated illness / injury requiring diagnostic workup.  Patient is alert and oriented.  Physical exam findings are stated above.  Patient is neurovascularly intact.  Doubt any scaphoid involvement given no apparent mechanism of injury.  Left wrist x-ray is negative.  Positive Tinel sign make suspicion for carpal tunnel more likely.  Patient placed in wrist brace for comfort.  Advise follow-up with hand Ortho surgeon for further evaluation if symptoms persist.  She is in stable condition for discharge  home.  FINAL CLINICAL IMPRESSION(S) / ED DIAGNOSES   Final diagnoses:  Left wrist pain   Rx / DC Orders   ED Discharge Orders          Ordered    meloxicam  (MOBIC ) 15 MG tablet  Daily        02/17/24 1556           Note:  This document was prepared using Dragon voice recognition software and may include unintentional dictation errors.    Margrette, Wyllow Seigler A, PA-C 02/17/24 1609    Nicholaus Rolland BRAVO, MD 02/17/24 [phone]

## 2024-03-10 ENCOUNTER — Ambulatory Visit: Payer: Self-pay

## 2024-03-10 VITALS — BP 127/69 | Wt 249.5 lb

## 2024-03-10 DIAGNOSIS — Z3009 Encounter for other general counseling and advice on contraception: Secondary | ICD-10-CM

## 2024-03-10 DIAGNOSIS — Z3200 Encounter for pregnancy test, result unknown: Secondary | ICD-10-CM

## 2024-03-10 LAB — PREGNANCY, URINE

## 2024-03-10 NOTE — Progress Notes (Signed)
 UPT  HCG appears near threshold for test. Per lab Please  resubmit in 48 to 72hrs. Pt states she took 2 at home pregnancy tests yest and they were positive. Advised pt to return to clinic next week. Based on LMP Dec 3 it's 4weeks since last period.  Did review positive pregnancy packet with pt and offered PNV today. Pt stated she would wait upon return for UPT.  Consulted with provider about positive tests at home . E. Holmes, NP voiced more than likely pregnant ,Recommended to return earliest Monday to repeat UPT.
